# Patient Record
Sex: Female | Born: 1980 | Race: White | Hispanic: No | Marital: Married | Smoking: Former smoker
Health system: Southern US, Community
[De-identification: ages and names within clinical notes are randomized; demographics above are authoritative.]

## PROBLEM LIST (undated history)

## (undated) DIAGNOSIS — J302 Other seasonal allergic rhinitis: Secondary | ICD-10-CM

## (undated) DIAGNOSIS — I341 Nonrheumatic mitral (valve) prolapse: Secondary | ICD-10-CM

## (undated) DIAGNOSIS — M533 Sacrococcygeal disorders, not elsewhere classified: Secondary | ICD-10-CM

## (undated) DIAGNOSIS — G8929 Other chronic pain: Secondary | ICD-10-CM

## (undated) DIAGNOSIS — K824 Cholesterolosis of gallbladder: Secondary | ICD-10-CM

## (undated) DIAGNOSIS — Z98811 Dental restoration status: Secondary | ICD-10-CM

## (undated) DIAGNOSIS — K811 Chronic cholecystitis: Secondary | ICD-10-CM

## (undated) DIAGNOSIS — J45909 Unspecified asthma, uncomplicated: Secondary | ICD-10-CM

## (undated) DIAGNOSIS — L309 Dermatitis, unspecified: Secondary | ICD-10-CM

## (undated) DIAGNOSIS — A6000 Herpesviral infection of urogenital system, unspecified: Secondary | ICD-10-CM

## (undated) HISTORY — DX: Unspecified asthma, uncomplicated: J45.909

## (undated) HISTORY — PX: WISDOM TOOTH EXTRACTION: SHX21

## (undated) HISTORY — DX: Nonrheumatic mitral (valve) prolapse: I34.1

---

## 2009-10-25 ENCOUNTER — Other Ambulatory Visit: Admission: RE | Admit: 2009-10-25 | Discharge: 2009-10-25 | Payer: Self-pay | Admitting: Family Medicine

## 2010-11-29 ENCOUNTER — Other Ambulatory Visit (HOSPITAL_COMMUNITY)
Admission: RE | Admit: 2010-11-29 | Discharge: 2010-11-29 | Disposition: A | Discharge status: Home / Self Care | Payer: BLUE CROSS/BLUE SHIELD | Source: Ambulatory Visit | Attending: Family Medicine | Admitting: Family Medicine

## 2010-11-29 ENCOUNTER — Other Ambulatory Visit: Payer: Self-pay | Admitting: Family Medicine

## 2010-11-29 DIAGNOSIS — Z124 Encounter for screening for malignant neoplasm of cervix: Secondary | ICD-10-CM | POA: Insufficient documentation

## 2013-01-14 ENCOUNTER — Other Ambulatory Visit: Payer: Self-pay | Admitting: Family Medicine

## 2013-01-14 ENCOUNTER — Other Ambulatory Visit (HOSPITAL_COMMUNITY)
Admission: RE | Admit: 2013-01-14 | Discharge: 2013-01-14 | Disposition: A | Discharge status: Home / Self Care | Payer: BC Managed Care – PPO | Source: Ambulatory Visit | Attending: Family Medicine | Admitting: Family Medicine

## 2013-01-14 DIAGNOSIS — Z113 Encounter for screening for infections with a predominantly sexual mode of transmission: Secondary | ICD-10-CM | POA: Insufficient documentation

## 2013-01-14 DIAGNOSIS — Z124 Encounter for screening for malignant neoplasm of cervix: Secondary | ICD-10-CM | POA: Insufficient documentation

## 2013-01-15 ENCOUNTER — Other Ambulatory Visit: Payer: Self-pay | Admitting: Family Medicine

## 2013-01-15 DIAGNOSIS — Z0000 Encounter for general adult medical examination without abnormal findings: Secondary | ICD-10-CM

## 2013-01-15 DIAGNOSIS — R102 Pelvic and perineal pain: Secondary | ICD-10-CM

## 2013-01-29 ENCOUNTER — Ambulatory Visit
Admission: RE | Admit: 2013-01-29 | Discharge: 2013-01-29 | Disposition: A | Discharge status: Home / Self Care | Payer: BC Managed Care – PPO | Source: Ambulatory Visit | Attending: Family Medicine | Admitting: Family Medicine

## 2013-01-29 DIAGNOSIS — R102 Pelvic and perineal pain: Secondary | ICD-10-CM

## 2013-01-29 DIAGNOSIS — Z0000 Encounter for general adult medical examination without abnormal findings: Secondary | ICD-10-CM

## 2013-04-28 ENCOUNTER — Other Ambulatory Visit: Payer: Self-pay | Admitting: Gastroenterology

## 2013-04-28 DIAGNOSIS — R109 Unspecified abdominal pain: Secondary | ICD-10-CM

## 2013-05-02 ENCOUNTER — Ambulatory Visit
Admission: RE | Admit: 2013-05-02 | Discharge: 2013-05-02 | Disposition: A | Discharge status: Home / Self Care | Payer: BC Managed Care – PPO | Source: Ambulatory Visit | Attending: Gastroenterology | Admitting: Gastroenterology

## 2013-05-02 DIAGNOSIS — R109 Unspecified abdominal pain: Secondary | ICD-10-CM

## 2013-05-28 ENCOUNTER — Other Ambulatory Visit (HOSPITAL_COMMUNITY): Payer: Self-pay | Admitting: Gastroenterology

## 2013-05-28 DIAGNOSIS — R109 Unspecified abdominal pain: Secondary | ICD-10-CM

## 2013-06-03 ENCOUNTER — Encounter (HOSPITAL_COMMUNITY)
Admission: RE | Admit: 2013-06-03 | Discharge: 2013-06-03 | Disposition: A | Discharge status: Home / Self Care | Payer: BC Managed Care – PPO | Source: Ambulatory Visit | Attending: Gastroenterology | Admitting: Gastroenterology

## 2013-06-03 DIAGNOSIS — R109 Unspecified abdominal pain: Secondary | ICD-10-CM | POA: Insufficient documentation

## 2013-06-03 MED ORDER — TECHNETIUM TC 99M MEBROFENIN IV KIT
5.3000 | PACK | Freq: Once | INTRAVENOUS | Status: AC | PRN
  Administered 2013-06-03: 5.3 via INTRAVENOUS

## 2013-06-12 ENCOUNTER — Ambulatory Visit (INDEPENDENT_AMBULATORY_CARE_PROVIDER_SITE_OTHER): Payer: Self-pay | Admitting: Surgery

## 2013-06-16 DIAGNOSIS — K811 Chronic cholecystitis: Secondary | ICD-10-CM

## 2013-06-16 DIAGNOSIS — K824 Cholesterolosis of gallbladder: Secondary | ICD-10-CM

## 2013-06-16 HISTORY — DX: Cholesterolosis of gallbladder: K82.4

## 2013-06-16 HISTORY — DX: Chronic cholecystitis: K81.1

## 2013-07-02 ENCOUNTER — Encounter (INDEPENDENT_AMBULATORY_CARE_PROVIDER_SITE_OTHER): Payer: Self-pay | Admitting: General Surgery

## 2013-07-02 ENCOUNTER — Telehealth (INDEPENDENT_AMBULATORY_CARE_PROVIDER_SITE_OTHER): Payer: Self-pay

## 2013-07-02 ENCOUNTER — Ambulatory Visit (INDEPENDENT_AMBULATORY_CARE_PROVIDER_SITE_OTHER): Payer: BC Managed Care – PPO | Admitting: General Surgery

## 2013-07-02 VITALS — BP 122/70 | HR 64 | Resp 16 | Ht 71.0 in | Wt 190.0 lb

## 2013-07-02 DIAGNOSIS — K811 Chronic cholecystitis: Secondary | ICD-10-CM

## 2013-07-02 DIAGNOSIS — K824 Cholesterolosis of gallbladder: Secondary | ICD-10-CM | POA: Insufficient documentation

## 2013-07-02 NOTE — Progress Notes (Addendum)
Patient ID: Christina Griffin, female   DOB: [birth year], 32 y.o.   MRN: [id number]  Chief Complaint  Patient presents with  . New Evaluation    eval GB    HPI Christina Griffin is a 32 y.o. female.  She is referred by Dr. Dorena Cookey for consideration of cholecystectomy. Her primary care physician is Dr. Juluis Rainier.  This is a very pleasant and generally very healthy young woman. She has a 3 month history of intermittent attacks of epigastric pain and sometimes right upper quadrant pain. There is no specific time of day that cause of her associated with this. This is not necessarily related to meals. This been no diarrhea no fevers no nausea no vomiting.She does feel bloated, and that is a new symptom as well.  She saw Dr. Madilyn Fireman. She took Nexium for 10 days without any relief of her symptoms. She had ultrasound which shows 2 filling defects which are non-mobile and were said to be gallbladder polyps. The ultrasound was otherwise completely normal. She had a hepatobiliary scan which actually showed a high ejection fraction, but her pain was reproduced with CCK infusion. No labs done. The only family history is a paternal grandmother who had a cholecystectomy.  Her past history is significant for mild intermittent asthma and mild anxiety. 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 She is on OCPs currently. She wants to have children.   Her aunt is with her throughout the encounter.   She had numerous helpful questions and took lots of notes. HPI  Past Medical History  Diagnosis Date  . Asthma     Past Surgical History  Procedure Laterality Date  . Wisdom tooth extraction      Family History  Problem Relation Age of Onset  . Cancer Paternal Aunt     breast    Social History History  Substance Use Topics  . Smoking status: Former Games developer  . Smokeless tobacco: Never Used  . Alcohol Use: Yes     Comment: 1-2/week    No  Known Allergies  Current Outpatient Prescriptions  Medication Sig Dispense Refill  . Multiple Vitamin (MULTI-VITAMIN DAILY PO) Take by mouth.      Marland Kitchen NUVARING 0.12-0.015 MG/24HR vaginal ring        No current facility-administered medications for this visit.    Review of Systems Review of Systems  Constitutional: Negative for fever, chills and unexpected weight change.  HENT: Negative for hearing loss, congestion, sore throat, trouble swallowing and voice change.   Eyes: Negative for visual disturbance.  Respiratory: Negative for cough and wheezing.   Cardiovascular: Negative for chest pain, palpitations and leg swelling.  Gastrointestinal: Positive for abdominal pain. Negative for nausea, vomiting, diarrhea, constipation, blood in stool, abdominal distention and anal bleeding.  Genitourinary: Negative for hematuria, vaginal bleeding and difficulty urinating.  Musculoskeletal: Negative for arthralgias.  Skin: Negative for rash and wound.  Neurological: Negative for seizures, syncope and headaches.  Hematological: Negative for adenopathy. Does not bruise/bleed easily.  Psychiatric/Behavioral: Negative for confusion.    Blood pressure 122/70, pulse 64, resp. rate 16, height 5\' 11"  (1.803 m), weight 190 lb (86.183 kg), last menstrual period 06/02/2013.  Physical Exam Physical Exam  Constitutional: She is oriented to person, place, and time. She appears well-developed and well-nourished. No distress.  HENT:  Head: Normocephalic and atraumatic.  Nose: Nose normal.  Mouth/Throat: No oropharyngeal exudate.  Eyes: Conjunctivae and EOM are  normal. Pupils are equal, round, and reactive to light. Left eye exhibits no discharge. No scleral icterus.  Neck: Neck supple. No JVD present. No tracheal deviation present. No thyromegaly present.  Cardiovascular: Normal rate, regular rhythm, normal heart sounds and intact distal pulses.   No murmur heard. Pulmonary/Chest: Effort normal and breath  sounds normal. No respiratory distress. She has no wheezes. She has no rales. She exhibits no tenderness.  Abdominal: Soft. Bowel sounds are normal. She exhibits no distension and no mass. There is no tenderness. There is no rebound and no guarding.  Musculoskeletal: She exhibits no edema and no tenderness.  Lymphadenopathy:    She has no cervical adenopathy.  Neurological: She is alert and oriented to person, place, and time. She exhibits normal muscle tone. Coordination normal.  Skin: Skin is warm. No rash noted. She is not diaphoretic. No erythema. No pallor.  Psychiatric: She has a normal mood and affect. Her behavior is normal. Judgment and thought content normal.    Data Reviewed Office notes from Dr. Dorena Cookey. Ultrasound. Hepatobiliary scan.  Assessment    Chronic cholecystitis and gallbladder polyps. She may have gallstones which are nonmobile. I suspect she is having biliary colic, although I cannot be 100% certain. Location of pain, repetitive pattern of pain, duration of symptoms, failure to respond to Nexium, imaging studies all suggest that biliary colic is the most likely etiology. I offered elective cholecystectomy to her as an option, and she would prefer to have that surgery rather than pursue further GI workup at this time. She is aware that if her symptoms persist, that she will need extensive GI workup thereafter.  Mild asthma     Plan    Scheduled for a laparoscopic cholecystectomy with cholangiogram as outpatient.  I discussed the indications, details, techniques and numerous risks of the surgery with her. I  discussed the patient information booklet with her including diagrams of the anatomy. She is aware that there is a possibility of conversion to open laparotomy, bleeding, infection, injury to adjacent organs, bile leak, hernia, and other unforeseen problems. She understands all of these issues well and all of her questions are answered. She agrees with this plan.         Angelia Mould. Derrell Lolling, M.D., [facility] General and Minimally invasive Surgery Breast and Colorectal Surgery Office:   [phone] Pager:   [phone]  07/02/2013, 5:42 PM

## 2013-07-02 NOTE — Patient Instructions (Signed)
You have had intermittent episodes of upper abdominal and right upper quadrant pain for almost 3 months now. You have filling defects in your gallbladder on the ultrasound, and abdominal pain was reproduced by the CCK infusion at the time of the HIDA-scan. You had had no improvement in your symptoms with a 10 day course of Nexium.  All these  factors strongly suggest that the gallbladder is the source of your pain, although we cannot be 100% certain.  You'll be scheduled for laparoscopic cholecystectomy with cholangiogram, possible open cholecystectomy in the near future.      Laparoscopic Cholecystectomy Laparoscopic cholecystectomy is surgery to remove the gallbladder. The gallbladder is located slightly to the right of center in the abdomen, behind the liver. It is a concentrating and storage sac for the bile produced in the liver. Bile aids in the digestion and absorption of fats. Gallbladder disease (cholecystitis) is an inflammation of your gallbladder. This condition is usually caused by a buildup of gallstones (cholelithiasis) in your gallbladder. Gallstones can block the flow of bile, resulting in inflammation and pain. In severe cases, emergency surgery may be required. When emergency surgery is not required, you will have time to prepare for the procedure. Laparoscopic surgery is an alternative to open surgery. Laparoscopic surgery usually has a shorter recovery time. Your common bile duct may also need to be examined and explored. Your caregiver will discuss this with you if he or she feels this should be done. If stones are found in the common bile duct, they may be removed. LET YOUR CAREGIVER KNOW ABOUT:  Allergies to food or medicine.  Medicines taken, including vitamins, herbs, eyedrops, over-the-counter medicines, and creams.  Use of steroids (by mouth or creams).  Previous problems with anesthetics or numbing medicines.  History of bleeding problems or blood  clots.  Previous surgery.  Other health problems, including diabetes and kidney problems.  Possibility of pregnancy, if this applies. RISKS AND COMPLICATIONS All surgery is associated with risks. Some problems that may occur following this procedure include:  Infection.  Damage to the common bile duct, nerves, arteries, veins, or other internal organs such as the stomach or intestines.  Bleeding.  A stone may remain in the common bile duct. BEFORE THE PROCEDURE  Do not take aspirin for 3 days prior to surgery or blood thinners for 1 week prior to surgery.  Do not eat or drink anything after midnight the night before surgery.  Let your caregiver know if you develop a cold or other infectious problem prior to surgery.  You should be present 60 minutes before the procedure or as directed. PROCEDURE  You will be given medicine that makes you sleep (general anesthetic). When you are asleep, your surgeon will make several small cuts (incisions) in your abdomen. One of these incisions is used to insert a small, lighted scope (laparoscope) into the abdomen. The laparoscope helps the surgeon see into your abdomen. Carbon dioxide gas will be pumped into your abdomen. The gas allows more room for the surgeon to perform your surgery. Other operating instruments are inserted through the other incisions. Laparoscopic procedures may not be appropriate when:  There is major scarring from previous surgery.  The gallbladder is extremely inflamed.  There are bleeding disorders or unexpected cirrhosis of the liver.  A pregnancy is near term.  Other conditions make the laparoscopic procedure impossible. If your surgeon feels it is not safe to continue with a laparoscopic procedure, he or she will perform an open abdominal  procedure. In this case, the surgeon will make an incision to open the abdomen. This gives the surgeon a larger view and field to work within. This may allow the surgeon to  perform procedures that sometimes cannot be performed with a laparoscope alone. Open surgery has a longer recovery time. AFTER THE PROCEDURE  You will be taken to the recovery area where a nurse will watch and check your progress.  You may be allowed to go home the same day.  Do not resume physical activities until directed by your caregiver.  You may resume a normal diet and activities as directed. Document Released: 10/02/2005 Document Revised: 12/25/2011 Document Reviewed: 03/17/2011 [facility] Patient Information 2014 [place], [place].

## 2013-07-02 NOTE — Telephone Encounter (Signed)
I left a message for the pt to call me and see if she can come in at 3:30 for a 4:00 appointment.  We had someone cancel.

## 2013-07-15 ENCOUNTER — Encounter (HOSPITAL_BASED_OUTPATIENT_CLINIC_OR_DEPARTMENT_OTHER): Payer: Self-pay | Admitting: *Deleted

## 2013-07-15 NOTE — Pre-Procedure Instructions (Signed)
To come for CBC, diff, CMET 

## 2013-07-16 ENCOUNTER — Encounter (HOSPITAL_BASED_OUTPATIENT_CLINIC_OR_DEPARTMENT_OTHER)
Admission: RE | Admit: 2013-07-16 | Discharge: 2013-07-16 | Disposition: A | Discharge status: Home / Self Care | Payer: BC Managed Care – PPO | Source: Ambulatory Visit | Attending: General Surgery | Admitting: General Surgery

## 2013-07-16 LAB — CBC WITH DIFFERENTIAL/PLATELET
Basophils Absolute: 0 10*3/uL (ref 0.0–0.1)
Basophils Relative: 0 % (ref 0–1)
Eosinophils Absolute: 0.3 10*3/uL (ref 0.0–0.7)
Hemoglobin: 12.5 g/dL (ref 12.0–15.0)
MCH: 30.3 pg (ref 26.0–34.0)
MCHC: 33.9 g/dL (ref 30.0–36.0)
MCV: 89.6 fL (ref 78.0–100.0)
Monocytes Absolute: 0.6 10*3/uL (ref 0.1–1.0)
Monocytes Relative: 7 % (ref 3–12)
Neutro Abs: 4.7 10*3/uL (ref 1.7–7.7)
Neutrophils Relative %: 55 % (ref 43–77)
RDW: 13 % (ref 11.5–15.5)

## 2013-07-16 LAB — COMPREHENSIVE METABOLIC PANEL
AST: 14 U/L (ref 0–37)
Albumin: 3.8 g/dL (ref 3.5–5.2)
Chloride: 105 mEq/L (ref 96–112)
Creatinine, Ser: 0.61 mg/dL (ref 0.50–1.10)
Glucose, Bld: 82 mg/dL (ref 70–99)
Total Bilirubin: 0.4 mg/dL (ref 0.3–1.2)

## 2013-07-17 NOTE — H&P (Signed)
thatiana Griffin   MRN:  [id number]   Description: 32 year old female  Provider: [name], MD  Department: [facility]        Diagnoses    Chronic cholecystitis    -  Primary    575.11    Gallbladder polyp        575.6      Reason for Visit    New Evaluation    eval GB        Current Vitals - Last Recorded    BP Pulse Resp Ht Wt BMI    122/70 64 16 5\' 11"  (1.803 m) 190 lb (86.183 kg) 26.51 kg/m2           History and Physical   [name], MD    Status: Signed                       HPI Christina Griffin is a 32 y.o. female.  She is referred by Dr. Dorena Cookey for consideration of cholecystectomy. Her primary care physician is Dr. Juluis Rainier.   This is a very pleasant and generally very healthy young woman. She has a 3 month history of intermittent attacks of epigastric pain and sometimes right upper quadrant pain. There is no specific time of day that cause of her associated with this. This is not necessarily related to meals. This been no diarrhea no fevers no nausea no vomiting.She does feel bloated, and that is a new symptom as well.   She saw Dr. Madilyn Fireman. She took Nexium for 10 days without any relief of her symptoms. She had ultrasound which shows 2 filling defects which are non-mobile and were said to be gallbladder polyps. The ultrasound was otherwise completely normal. She had a hepatobiliary scan which actually showed a high ejection fraction, but her pain was reproduced with CCK infusion. No labs done. The only family history is a paternal grandmother who had a cholecystectomy.   Her past history is significant for mild intermittent asthma and mild anxiety. 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 She is on OCPs currently. She wants to have children.    Her aunt is with her throughout the encounter.   She had numerous helpful questions and took lots of notes.        Past Medical History   Diagnosis  Date   .  Asthma           Past Surgical History   Procedure  Laterality  Date   .  Wisdom tooth extraction             Family History   Problem  Relation  Age of Onset   .  Cancer  Paternal Aunt         breast        Social History History   Substance Use Topics   .  Smoking status:  Former Games developer   .  Smokeless tobacco:  Never Used   .  Alcohol Use:  Yes         Comment: 1-2/week        No Known Allergies    Current Outpatient Prescriptions   Medication  Sig  Dispense  Refill   .  Multiple Vitamin (MULTI-VITAMIN DAILY PO)  Take by mouth.         Marland Kitchen  NUVARING 0.12-0.015 MG/24HR vaginal ring  Review of Systems   Constitutional: Negative for fever, chills and unexpected weight change.  HENT: Negative for hearing loss, congestion, sore throat, trouble swallowing and voice change.   Eyes: Negative for visual disturbance.  Respiratory: Negative for cough and wheezing.   Cardiovascular: Negative for chest pain, palpitations and leg swelling.  Gastrointestinal: Positive for abdominal pain. Negative for nausea, vomiting, diarrhea, constipation, blood in stool, abdominal distention and anal bleeding.  Genitourinary: Negative for hematuria, vaginal bleeding and difficulty urinating.  Musculoskeletal: Negative for arthralgias.  Skin: Negative for rash and wound.  Neurological: Negative for seizures, syncope and headaches.  Hematological: Negative for adenopathy. Does not bruise/bleed easily.  Psychiatric/Behavioral: Negative for confusion.      Blood pressure 122/70, pulse 64, resp. rate 16, height 5\' 11"  (1.803 m), weight 190 lb (86.183 kg), last menstrual period 06/02/2013.   Physical Exam  Constitutional: She is oriented to person, place, and time. She appears well-developed and well-nourished. No distress.  HENT:   Head: Normocephalic and atraumatic.   Nose: Nose normal.   Mouth/Throat: No  oropharyngeal exudate.  Eyes: Conjunctivae and EOM are normal. Pupils are equal, round, and reactive to light. Left eye exhibits no discharge. No scleral icterus.  Neck: Neck supple. No JVD present. No tracheal deviation present. No thyromegaly present.  Cardiovascular: Normal rate, regular rhythm, normal heart sounds and intact distal pulses.    No murmur heard. Pulmonary/Chest: Effort normal and breath sounds normal. No respiratory distress. She has no wheezes. She has no rales. She exhibits no tenderness.  Abdominal: Soft. Bowel sounds are normal. She exhibits no distension and no mass. There is no tenderness. There is no rebound and no guarding.  Musculoskeletal: She exhibits no edema and no tenderness.  Lymphadenopathy:    She has no cervical adenopathy.  Neurological: She is alert and oriented to person, place, and time. She exhibits normal muscle tone. Coordination normal.  Skin: Skin is warm. No rash noted. She is not diaphoretic. No erythema. No pallor.  Psychiatric: She has a normal mood and affect. Her behavior is normal. Judgment and thought content normal.      Data Reviewed Office notes from Dr. Dorena Cookey. Ultrasound. Hepatobiliary scan.   Assessment    Chronic cholecystitis and gallbladder polyps. She may have gallstones which are nonmobile. I suspect she is having biliary colic, although I cannot be 100% certain. Location of pain, repetitive pattern of pain, duration of symptoms, failure to respond to Nexium, imaging studies all suggest that biliary colic is the most likely etiology. I offered elective cholecystectomy to her as an option, and she would prefer to have that surgery resident to pursue further GI workup at this time. She is aware that if her symptoms persist, that she will need extensive GI workup thereafter.   Mild asthma      Plan    Scheduled for a laparoscopic cholecystectomy with cholangiogram as outpatient.   I discussed the indications, details,  techniques and numerous risks of the surgery with her. Abdomen the patient information booklet with her including diagrams of the anatomy. She is that there is a possibility of conversion to open laparotomy, bleeding, infection, injury to adjacent organs, bile leak, hernia, and other unforeseen problems. She understands all of these issues well and all of her questions rancher. She agrees with this plan.           Angelia Mould. Derrell Lolling, M.D., [facility] General and Minimally invasive Surgery Breast and Colorectal Surgery Office:  [phone] Pager:   [phone]

## 2013-07-18 ENCOUNTER — Ambulatory Visit (HOSPITAL_COMMUNITY): Payer: BC Managed Care – PPO

## 2013-07-18 ENCOUNTER — Ambulatory Visit (HOSPITAL_BASED_OUTPATIENT_CLINIC_OR_DEPARTMENT_OTHER)
Admission: RE | Admit: 2013-07-18 | Discharge: 2013-07-18 | Disposition: A | Discharge status: Home / Self Care | Payer: BC Managed Care – PPO | Source: Ambulatory Visit | Attending: General Surgery | Admitting: General Surgery

## 2013-07-18 ENCOUNTER — Encounter (HOSPITAL_BASED_OUTPATIENT_CLINIC_OR_DEPARTMENT_OTHER): Payer: Self-pay | Admitting: Anesthesiology

## 2013-07-18 ENCOUNTER — Encounter (HOSPITAL_BASED_OUTPATIENT_CLINIC_OR_DEPARTMENT_OTHER)
Admission: RE | Disposition: A | Discharge status: Home / Self Care | Payer: Self-pay | Source: Ambulatory Visit | Attending: General Surgery

## 2013-07-18 ENCOUNTER — Ambulatory Visit (HOSPITAL_BASED_OUTPATIENT_CLINIC_OR_DEPARTMENT_OTHER): Payer: BC Managed Care – PPO | Admitting: Anesthesiology

## 2013-07-18 ENCOUNTER — Encounter (HOSPITAL_BASED_OUTPATIENT_CLINIC_OR_DEPARTMENT_OTHER): Payer: Self-pay

## 2013-07-18 DIAGNOSIS — J45909 Unspecified asthma, uncomplicated: Secondary | ICD-10-CM | POA: Insufficient documentation

## 2013-07-18 DIAGNOSIS — R110 Nausea: Secondary | ICD-10-CM | POA: Insufficient documentation

## 2013-07-18 DIAGNOSIS — K824 Cholesterolosis of gallbladder: Secondary | ICD-10-CM

## 2013-07-18 DIAGNOSIS — K811 Chronic cholecystitis: Secondary | ICD-10-CM

## 2013-07-18 DIAGNOSIS — Z01818 Encounter for other preprocedural examination: Secondary | ICD-10-CM | POA: Insufficient documentation

## 2013-07-18 HISTORY — DX: Other chronic pain: G89.29

## 2013-07-18 HISTORY — DX: Chronic cholecystitis: K81.1

## 2013-07-18 HISTORY — DX: Cholesterolosis of gallbladder: K82.4

## 2013-07-18 HISTORY — DX: Sacrococcygeal disorders, not elsewhere classified: M53.3

## 2013-07-18 HISTORY — PX: CHOLECYSTECTOMY: SHX55

## 2013-07-18 HISTORY — DX: Dermatitis, unspecified: L30.9

## 2013-07-18 HISTORY — DX: Other seasonal allergic rhinitis: J30.2

## 2013-07-18 HISTORY — DX: Dental restoration status: Z98.811

## 2013-07-18 SURGERY — LAPAROSCOPIC CHOLECYSTECTOMY WITH INTRAOPERATIVE CHOLANGIOGRAM
Anesthesia: General | Site: Abdomen | Wound class: Clean Contaminated

## 2013-07-18 MED ORDER — LIDOCAINE HCL (CARDIAC) 20 MG/ML IV SOLN
INTRAVENOUS | Status: DC | PRN
  Administered 2013-07-18: 80 mg via INTRAVENOUS

## 2013-07-18 MED ORDER — SODIUM CHLORIDE 0.9 % IR SOLN
Status: DC | PRN
  Administered 2013-07-18: 1

## 2013-07-18 MED ORDER — MIDAZOLAM HCL 2 MG/ML PO SYRP: 12.0000 mg | ORAL_SOLUTION | Freq: Once | ORAL | Status: DC | PRN

## 2013-07-18 MED ORDER — SODIUM CHLORIDE 0.9 % IV SOLN: 250.0000 mL | INTRAVENOUS | Status: DC | PRN

## 2013-07-18 MED ORDER — ACETAMINOPHEN 325 MG PO TABS: 650.0000 mg | ORAL_TABLET | ORAL | Status: DC | PRN

## 2013-07-18 MED ORDER — OXYCODONE HCL 5 MG/5ML PO SOLN: 5.0000 mg | Freq: Once | ORAL | Status: DC | PRN

## 2013-07-18 MED ORDER — ONDANSETRON HCL 4 MG/2ML IJ SOLN: 4.0000 mg | Freq: Four times a day (QID) | INTRAMUSCULAR | Status: DC | PRN

## 2013-07-18 MED ORDER — PROPOFOL 10 MG/ML IV BOLUS
INTRAVENOUS | Status: DC | PRN
  Administered 2013-07-18: 50 mg via INTRAVENOUS
  Administered 2013-07-18: 200 mg via INTRAVENOUS

## 2013-07-18 MED ORDER — OXYCODONE HCL 5 MG PO TABS
5.0000 mg | ORAL_TABLET | Freq: Once | ORAL | Status: DC | PRN
Start: 2013-07-18 — End: 2013-07-18

## 2013-07-18 MED ORDER — MIDAZOLAM HCL 5 MG/5ML IJ SOLN
INTRAMUSCULAR | Status: DC | PRN
  Administered 2013-07-18: 2 mg via INTRAVENOUS

## 2013-07-18 MED ORDER — HYDROMORPHONE HCL PF 1 MG/ML IJ SOLN
0.2500 mg | INTRAMUSCULAR | Status: DC | PRN
  Administered 2013-07-18 (×3): 0.5 mg via INTRAVENOUS

## 2013-07-18 MED ORDER — ROCURONIUM BROMIDE 100 MG/10ML IV SOLN
INTRAVENOUS | Status: DC | PRN
  Administered 2013-07-18: 50 mg via INTRAVENOUS

## 2013-07-18 MED ORDER — BUPIVACAINE-EPINEPHRINE 0.5% -1:200000 IJ SOLN
INTRAMUSCULAR | Status: DC | PRN
  Administered 2013-07-18: 14 mL

## 2013-07-18 MED ORDER — HYDROCODONE-ACETAMINOPHEN 5-325 MG PO TABS: 1.0000 | ORAL_TABLET | ORAL | Status: DC | PRN

## 2013-07-18 MED ORDER — HEMOSTATIC AGENTS (NO CHARGE) OPTIME
TOPICAL | Status: DC | PRN
  Administered 2013-07-18: 1 via TOPICAL

## 2013-07-18 MED ORDER — SODIUM CHLORIDE 0.9 % IV SOLN
INTRAVENOUS | Status: DC | PRN
  Administered 2013-07-18: 14:00:00

## 2013-07-18 MED ORDER — ACETAMINOPHEN 650 MG RE SUPP: 650.0000 mg | RECTAL | Status: DC | PRN

## 2013-07-18 MED ORDER — FENTANYL CITRATE 0.05 MG/ML IJ SOLN
INTRAMUSCULAR | Status: DC | PRN
  Administered 2013-07-18 (×5): 50 ug via INTRAVENOUS

## 2013-07-18 MED ORDER — SODIUM CHLORIDE 0.9 % IV SOLN: INTRAVENOUS | Status: DC

## 2013-07-18 MED ORDER — FENTANYL CITRATE 0.05 MG/ML IJ SOLN: 50.0000 ug | INTRAMUSCULAR | Status: DC | PRN

## 2013-07-18 MED ORDER — CEFAZOLIN SODIUM-DEXTROSE 2-3 GM-% IV SOLR
2.0000 g | INTRAVENOUS | Status: AC
  Administered 2013-07-18: 2 g via INTRAVENOUS

## 2013-07-18 MED ORDER — LACTATED RINGERS IV SOLN
INTRAVENOUS | Status: DC
  Administered 2013-07-18 (×2): via INTRAVENOUS

## 2013-07-18 MED ORDER — PROMETHAZINE HCL 25 MG/ML IJ SOLN
6.2500 mg | INTRAMUSCULAR | Status: DC | PRN
  Administered 2013-07-18: 12.5 mg via INTRAVENOUS

## 2013-07-18 MED ORDER — MORPHINE SULFATE 2 MG/ML IJ SOLN: 2.0000 mg | INTRAMUSCULAR | Status: DC | PRN

## 2013-07-18 MED ORDER — SODIUM CHLORIDE 0.9 % IJ SOLN: 3.0000 mL | Freq: Two times a day (BID) | INTRAMUSCULAR | Status: DC

## 2013-07-18 MED ORDER — DEXAMETHASONE SODIUM PHOSPHATE 4 MG/ML IJ SOLN
INTRAMUSCULAR | Status: DC | PRN
  Administered 2013-07-18: 10 mg via INTRAVENOUS

## 2013-07-18 MED ORDER — NEOSTIGMINE METHYLSULFATE 1 MG/ML IJ SOLN
INTRAMUSCULAR | Status: DC | PRN
  Administered 2013-07-18: 3 mg via INTRAVENOUS

## 2013-07-18 MED ORDER — ONDANSETRON HCL 4 MG/2ML IJ SOLN
INTRAMUSCULAR | Status: DC | PRN
  Administered 2013-07-18: 4 mg via INTRAVENOUS

## 2013-07-18 MED ORDER — OXYCODONE HCL 5 MG PO TABS: 5.0000 mg | ORAL_TABLET | ORAL | Status: DC | PRN

## 2013-07-18 MED ORDER — EPHEDRINE SULFATE 50 MG/ML IJ SOLN
INTRAMUSCULAR | Status: DC | PRN
  Administered 2013-07-18: 10 mg via INTRAVENOUS

## 2013-07-18 MED ORDER — SODIUM CHLORIDE 0.9 % IJ SOLN: 3.0000 mL | INTRAMUSCULAR | Status: DC | PRN

## 2013-07-18 MED ORDER — GLYCOPYRROLATE 0.2 MG/ML IJ SOLN
INTRAMUSCULAR | Status: DC | PRN
  Administered 2013-07-18: 0.6 mg via INTRAVENOUS

## 2013-07-18 MED ORDER — MIDAZOLAM HCL 2 MG/2ML IJ SOLN: 1.0000 mg | INTRAMUSCULAR | Status: DC | PRN

## 2013-07-18 MED ORDER — KETOROLAC TROMETHAMINE 30 MG/ML IJ SOLN
INTRAMUSCULAR | Status: DC | PRN
  Administered 2013-07-18: 30 mg via INTRAVENOUS

## 2013-07-18 MED ORDER — CHLORHEXIDINE GLUCONATE 4 % EX LIQD: 1.0000 "application " | Freq: Once | CUTANEOUS | Status: DC

## 2013-07-18 SURGICAL SUPPLY — 45 items
ADH SKN CLS APL DERMABOND .7 (GAUZE/BANDAGES/DRESSINGS) ×2
APPLIER CLIP ROT 10 11.4 M/L (STAPLE) ×2
APR CLP MED LRG 11.4X10 (STAPLE) ×1
BAG SPEC RTRVL LRG 6X4 10 (ENDOMECHANICALS) ×1
BLADE SURG ROTATE 9660 (MISCELLANEOUS) IMPLANT
CANISTER SUCTION 1200CC (MISCELLANEOUS) ×1 IMPLANT
CANISTER SUCTION 2500CC (MISCELLANEOUS) ×1 IMPLANT
CHLORAPREP W/TINT 26ML (MISCELLANEOUS) ×2 IMPLANT
CLIP APPLIE ROT 10 11.4 M/L (STAPLE) ×1 IMPLANT
CLOTH BEACON ORANGE TIMEOUT ST (SAFETY) ×2 IMPLANT
COVER MAYO STAND STRL (DRAPES) ×1 IMPLANT
DECANTER SPIKE VIAL GLASS SM (MISCELLANEOUS) ×1 IMPLANT
DERMABOND ADVANCED (GAUZE/BANDAGES/DRESSINGS) ×2
DERMABOND ADVANCED .7 DNX12 (GAUZE/BANDAGES/DRESSINGS) ×1 IMPLANT
DRAPE C-ARM 42X72 X-RAY (DRAPES) ×1 IMPLANT
DRAPE UTILITY XL STRL (DRAPES) ×2 IMPLANT
ELECT REM PT RETURN 9FT ADLT (ELECTROSURGICAL) ×2
ELECTRODE REM PT RTRN 9FT ADLT (ELECTROSURGICAL) ×1 IMPLANT
FILTER SMOKE EVAC LAPAROSHD (FILTER) IMPLANT
GLOVE BIO SURGEON STRL SZ8 (GLOVE) ×2 IMPLANT
GLOVE BIOGEL PI IND STRL 7.0 (GLOVE) IMPLANT
GLOVE BIOGEL PI IND STRL 8 (GLOVE) IMPLANT
GLOVE BIOGEL PI INDICATOR 7.0 (GLOVE) ×1
GLOVE BIOGEL PI INDICATOR 8 (GLOVE) ×3
GLOVE ECLIPSE 6.5 STRL STRAW (GLOVE) ×1 IMPLANT
GLOVE EUDERMIC 7 POWDERFREE (GLOVE) ×2 IMPLANT
GLOVE EXAM NITRILE MD LF STRL (GLOVE) ×1 IMPLANT
GOWN PREVENTION PLUS XLARGE (GOWN DISPOSABLE) ×5 IMPLANT
GOWN PREVENTION PLUS XXLARGE (GOWN DISPOSABLE) ×1 IMPLANT
HEMOSTAT SNOW SURGICEL 2X4 (HEMOSTASIS) ×1 IMPLANT
NS IRRIG 1000ML POUR BTL (IV SOLUTION) IMPLANT
PACK BASIN DAY SURGERY FS (CUSTOM PROCEDURE TRAY) ×2 IMPLANT
POUCH SPECIMEN RETRIEVAL 10MM (ENDOMECHANICALS) ×2 IMPLANT
SCISSORS LAP 5X35 DISP (ENDOMECHANICALS) IMPLANT
SET CHOLANGIOGRAPH 5 50 .035 (SET/KITS/TRAYS/PACK) ×1 IMPLANT
SET IRRIG TUBING LAPAROSCOPIC (IRRIGATION / IRRIGATOR) ×2 IMPLANT
SLEEVE ENDOPATH XCEL 5M (ENDOMECHANICALS) ×2 IMPLANT
SLEEVE SCD COMPRESS KNEE MED (MISCELLANEOUS) ×2 IMPLANT
SUT MNCRL AB 4-0 PS2 18 (SUTURE) ×2 IMPLANT
SUT VICRYL 0 UR6 27IN ABS (SUTURE) IMPLANT
TOWEL OR 17X24 6PK STRL BLUE (TOWEL DISPOSABLE) ×2 IMPLANT
TRAY LAPAROSCOPIC (CUSTOM PROCEDURE TRAY) ×2 IMPLANT
TROCAR XCEL BLUNT TIP 100MML (ENDOMECHANICALS) ×2 IMPLANT
TROCAR XCEL NON-BLD 11X100MML (ENDOMECHANICALS) ×2 IMPLANT
TROCAR XCEL NON-BLD 5MMX100MML (ENDOMECHANICALS) ×2 IMPLANT

## 2013-07-18 NOTE — Transfer of Care (Signed)
Immediate Anesthesia Transfer of Care Note  Patient: Christina Griffin  Procedure(s) Performed: Procedure(s): LAPAROSCOPIC CHOLECYSTECTOMY WITH INTRAOPERATIVE CHOLANGIOGRAM (N/A)  Patient Location: PACU  Anesthesia Type:General  Level of Consciousness: awake, alert  and oriented  Airway & Oxygen Therapy: Patient Spontanous Breathing  Post-op Assessment: Report given to PACU RN  Post vital signs: Reviewed and stable  Complications: No apparent anesthesia complications

## 2013-07-18 NOTE — Progress Notes (Signed)
Patient c/o nausea, medicated as per order.

## 2013-07-18 NOTE — Anesthesia Procedure Notes (Signed)
Procedure Name: Intubation Date/Time: 07/18/2013 12:51 PM Performed by: Maris Berger T Pre-anesthesia Checklist: Patient identified, Emergency Drugs available, Suction available and Patient being monitored Patient Re-evaluated:Patient Re-evaluated prior to inductionOxygen Delivery Method: Circle System Utilized Preoxygenation: Pre-oxygenation with 100% oxygen Intubation Type: IV induction Ventilation: Mask ventilation without difficulty Laryngoscope Size: Mac and 3 Grade View: Grade I Tube type: Oral Tube size: 7.0 mm Number of attempts: 1 Airway Equipment and Method: stylet and oral airway Placement Confirmation: ETT inserted through vocal cords under direct vision,  positive ETCO2 and breath sounds checked- equal and bilateral Secured at: 22 cm Tube secured with: Tape Dental Injury: Teeth and Oropharynx as per pre-operative assessment

## 2013-07-18 NOTE — Interval H&P Note (Signed)
History and Physical Interval Note:  07/18/2013 12:16 PM  Christina Griffin  has presented today for surgery, with the diagnosis of Chronic cholecystitis and gallbladder polyps  The goals and the various methods of treatment have been discussed with the patient and family. After consideration of risks, benefits and other options for treatment, the patient has consented to  Procedure(s): LAPAROSCOPIC CHOLECYSTECTOMY WITH INTRAOPERATIVE CHOLANGIOGRAM(possible open) (N/A) as a surgical intervention .  The patient's history has been reviewed, patient examined, no change in status, stable for surgery.  I have reviewed the patient's chart and labs.  Questions were answered to the patient's satisfaction.     [name]

## 2013-07-18 NOTE — Anesthesia Postprocedure Evaluation (Signed)
  Anesthesia Post-op Note  Patient: Christina Griffin  Procedure(s) Performed: Procedure(s): LAPAROSCOPIC CHOLECYSTECTOMY WITH INTRAOPERATIVE CHOLANGIOGRAM (N/A)  Patient Location: PACU  Anesthesia Type:General  Level of Consciousness: awake and alert   Airway and Oxygen Therapy: Patient Spontanous Breathing  Post-op Pain: mild  Post-op Assessment: Post-op Vital signs reviewed, Patient's Cardiovascular Status Stable and Respiratory Function Stable  Post-op Vital Signs: stable  Complications: No apparent anesthesia complications

## 2013-07-18 NOTE — Anesthesia Preprocedure Evaluation (Signed)
Anesthesia Evaluation  Patient identified by MRN, date of birth, ID band Patient awake    History of Anesthesia Complications Negative for: history of anesthetic complications  Airway Mallampati: I  Neck ROM: Full    Dental  (+) Teeth Intact   Pulmonary asthma ,  breath sounds clear to auscultation        Cardiovascular Rhythm:Regular Rate:Normal     Neuro/Psych    GI/Hepatic negative GI ROS, Neg liver ROS,   Endo/Other  negative endocrine ROS  Renal/GU negative Renal ROS     Musculoskeletal negative musculoskeletal ROS (+)   Abdominal   Peds  Hematology negative hematology ROS (+)   Anesthesia Other Findings   Reproductive/Obstetrics negative OB ROS                           Anesthesia Physical Anesthesia Plan  ASA: I  Anesthesia Plan: General   Post-op Pain Management:    Induction: Intravenous  Airway Management Planned: Oral ETT  Additional Equipment:   Intra-op Plan:   Post-operative Plan: Extubation in OR  Informed Consent: I have reviewed the patients History and Physical, chart, labs and discussed the procedure including the risks, benefits and alternatives for the proposed anesthesia with the patient or authorized representative who has indicated his/her understanding and acceptance.   Dental advisory given  Plan Discussed with: CRNA and Surgeon  Anesthesia Plan Comments:         Anesthesia Quick Evaluation

## 2013-07-18 NOTE — Op Note (Signed)
Patient Name:           Christina Griffin   Date of Surgery:        07/18/2013  Pre op Diagnosis:      Chronic cholecystitis and gallbladder polyps  Post op Diagnosis:    same  Procedure:                 Laparoscopic cholecystectomy with cholangiogram  Surgeon:                     Angelia Mould. Derrell Lolling, M.D., FACS  Assistant:                      Karie Soda, M.D., FACS  Operative Indications:   Christina Griffin is a 32 y.o. female. She is referred by Dr. Dorena Cookey for consideration of cholecystectomy. Her primary care physician is Dr. Juluis Rainier.  This is a very pleasant and generally very healthy young woman. She has a 3 month history of intermittent attacks of epigastric pain and sometimes right upper quadrant pain. There is no specific time of day that cause of her associated with this. This is not necessarily related to meals. This been no diarrhea no fevers no nausea no vomiting.She does feel bloated, and that is a new symptom as well.  She saw Dr. Madilyn Fireman. She took Nexium for 10 days without any relief of her symptoms. She had ultrasound which shows 2 filling defects which are non-mobile and were said to be gallbladder polyps. The ultrasound was otherwise completely normal. She had a hepatobiliary scan which actually showed a high ejection fraction, but her pain was reproduced with CCK infusion. The only family history is a paternal grandmother who had a cholecystectomy.  Her past history is significant for mild intermittent asthma and mild anxiety. . She is brought to the operating room electively    Operative Findings:       The gallbladder was thin-walled but was discolored. Dissection of the gallbladder from its bed was somewhat difficult suggesting chronic inflammation. The anatomy of the cystic duct, cystic artery, and common bile duct were conventional. The cholangiogram was essentially normal showing normal intrahepatic and extrahepatic biliary anatomy, no filling  defects, and good flow of contrast into the duodenum. The diameter of the common hepatic duct and common bile duct were very narrow, no more than 2-3 mm. There were no strictures. The liver, stomach, duodenum, small intestine, and large intestine were grossly normal to inspection.  Procedure in Detail:          Following the induction of general endotracheal anesthesia the patient's abdomen was prepped and draped in a sterile fashion, intravenous antibiotics were given, and a surgical time out performed. 0.5% Marcaine with epinephrine was used as local infiltration anesthetic. A vertical incision was made at the lower rim of the umbilicus. The fascia was incised in the midline and the abdominal cavity entered under direct vision. An 11 mm Hassan trocar was inserted and secured the purse string suture of 0 Vicryl. Pneumoperitoneum was created and a videocamera was inserted. An 11 mm trocar was placed in the subxiphoid region and two 5 mm trocars in the right upper quadrant. The gallbladder fundus was elevated. The infundibulum was retracted. I dissected the peritoneum off of the right and left sides of the neck of the gallbladder. I carefully dissected out the cystic artery and cystic duct and created a large window behind these structures  giving a good critical view. The cystic artery was secured with multiple metal clips and divided. The cholangiogram catheter was inserted into the cystic duct and a cholangiogram was obtained using the C-arm. The findings were normal as described above. The cholangiogram catheter was removed, the cystic duct was secured with multiple metal clips and divided. The gallbladder was dissected from its bed with electrocautery, placed in a specimen bag and removed. The dissection of the gallbladder from its bed was somewhat tedious. We had to cauterize the liver in several places. We irrigated out the subhepatic and subphrenic spaces and they were completely clear.  I placed a piece of  Snow hemostatic sponge in the gallbladder bed. Everything looked fine. After removal of the gallbladder we released the pneumoperitoneum and removed the trocars. The fascia at the umbilicus was closed with 0 Vicryl sutures and the skin incisions closed with subcuticular sutures of 4-0 Monocryl and Dermabond. The patient was taken to recovery in stable condition. EBL 20 cc. Counts correct. Complications none.     Angelia Mould. Derrell Lolling, M.D., FACS General and Minimally Invasive Surgery Breast and Colorectal Surgery  07/18/2013 2:03 PM

## 2013-07-21 ENCOUNTER — Encounter (HOSPITAL_BASED_OUTPATIENT_CLINIC_OR_DEPARTMENT_OTHER): Payer: Self-pay | Admitting: General Surgery

## 2013-08-19 ENCOUNTER — Ambulatory Visit (INDEPENDENT_AMBULATORY_CARE_PROVIDER_SITE_OTHER): Payer: BC Managed Care – PPO | Admitting: General Surgery

## 2013-08-19 ENCOUNTER — Encounter (INDEPENDENT_AMBULATORY_CARE_PROVIDER_SITE_OTHER): Payer: Self-pay | Admitting: General Surgery

## 2013-08-19 VITALS — BP 118/66 | HR 80 | Temp 97.4°F | Resp 14 | Ht 71.0 in | Wt 183.4 lb

## 2013-08-19 DIAGNOSIS — K824 Cholesterolosis of gallbladder: Secondary | ICD-10-CM

## 2013-08-19 DIAGNOSIS — K811 Chronic cholecystitis: Secondary | ICD-10-CM

## 2013-08-19 NOTE — Patient Instructions (Signed)
You are recovering from your gallbladder surgery without any obvious surgical complications.  You have been given a copy of the pathology report which confirmed chronic cholecystitis and polyps.  You may resume normal physical activities without restriction  Low-fat diet is recommended  Return to see Dr. Derrell Lolling as needed.

## 2013-08-19 NOTE — Progress Notes (Signed)
Patient ID: Christina Griffin, female   DOB: 1981-04-13, 32 y.o.   MRN: [id number]  History: This patient underwent laparoscopic cholecystectomy with cholangiogram on 07/18/2013 for histologically confirmed chronic cholecystitis with benign adenomatous polyps. She is feeling much better. Appetite normal. Bowel movements basically normal. Still a little incisional soreness. She is pleased with her progress  Exam: Patient looks good. No distress. Abdomen soft. Incision is healing normally. No hernias  Assessment: Chronic cholecystitis with polyps. Recovering uneventfully following laparoscopic cholecystectomy with cholangiogram  Plan: Low fat diet Resume normal activities without restrictions Return to see me as needed.   Angelia Mould. Derrell Lolling, M.D., [facility] General and Minimally invasive Surgery Breast and Colorectal Surgery Office:   [phone] Pager:   [phone]

## 2013-10-14 ENCOUNTER — Encounter (INDEPENDENT_AMBULATORY_CARE_PROVIDER_SITE_OTHER): Payer: Self-pay | Admitting: General Surgery

## 2013-10-14 ENCOUNTER — Ambulatory Visit (INDEPENDENT_AMBULATORY_CARE_PROVIDER_SITE_OTHER): Payer: BC Managed Care – PPO | Admitting: General Surgery

## 2013-10-14 VITALS — BP 108/78 | HR 74 | Temp 98.4°F | Resp 16 | Ht 71.0 in | Wt 193.4 lb

## 2013-10-14 DIAGNOSIS — K811 Chronic cholecystitis: Secondary | ICD-10-CM

## 2013-10-14 DIAGNOSIS — K824 Cholesterolosis of gallbladder: Secondary | ICD-10-CM

## 2013-10-14 NOTE — Progress Notes (Signed)
Patient ID: azzie thiem, female   DOB: [birth year], 32 y.o.   MRN: [id number] History: This patient underwent laparoscopic cholecystectomy with cholangiogram on 07/18/2013. She has done very well and was discharged from our care. She returns because of a small eschar on the lower wound that has not completely healed. She is otherwise doing well.   exam: Patient looks good. No distress Abdomen soft. Nontender. Infraumbilical incision reveals a tiny, 2 mm eschar on the lower end. No cellulitis or abscess. No odor or purulence. After alcohol prep I debrided the eschar and I think I removed a few fragments of Monocryl suture. Redressed.  Assessment: Suture granuloma, anticipate this will heal spontaneously with just good wound care and hygiene. No antibiotics indicated  Plan: I explained my impression and discussed the pathophysiology with her. Wash wound gently with dial soap twice a day. Return if this has not healed significantly in 2 weeks. Otherwise see me when necessary.   Angelia Mould. Derrell Lolling, M.D., [facility] General and Minimally invasive Surgery Breast and Colorectal Surgery Office:   [phone] Pager:   [phone]

## 2013-10-14 NOTE — Patient Instructions (Signed)
The small scab, or eschar on your infraumbilical incision probably represents a small area that has not healed yet. Most likely this is due to retained suture material. I think that I remove the suture material today.  Wash this area once or twice a day with Dial soap..  Do not use any creams or ointments. Bactine is okay.  Return to see Dr. Derrell Lolling if this does not look significantly better in 2 weeks. We anticipate that this will heal completely.

## 2013-10-16 NOTE — L&D Delivery Note (Signed)
Delivery Note At 5:02 PM a viable female was delivered via Vaginal, Spontaneous Delivery (Presentation:occiput anterior  ;  ).  APGAR: 8, 9; weight  .   Placenta status: Intact, Spontaneous.  Cord: 3 vessels with the following complications: Knot.  Cord pH: NA  Anesthesia: Epidural  Episiotomy: None Lacerations: 2nd degree;Perineal Suture Repair: 3.0 vicryl Est. Blood Loss (mL): 350 Mom to postpartum.  Baby to Couplet care / Skin to Skin.  Caroleen Stoermer J. 08/17/2014, 5:39 PM

## 2013-11-21 IMAGING — US US PELVIS COMPLETE
1 series · 14 of 25 positions shown · non-contrast
Comparison: None

CLINICAL DATA: Right pelvic pain

TRANSABDOMINAL AND TRANSVAGINAL ULTRASOUND OF PELVIS
TECHNIQUE: Both transabdominal and transvaginal ultrasound
examinations of the pelvis were performed including evaluation of
the uterus, ovaries, adnexal regions, and pelvic cul-de-sac.

[Series 1: us pelvis complete · 0.13mm/px · 14 of 40 slices shown]
[im 1/40]
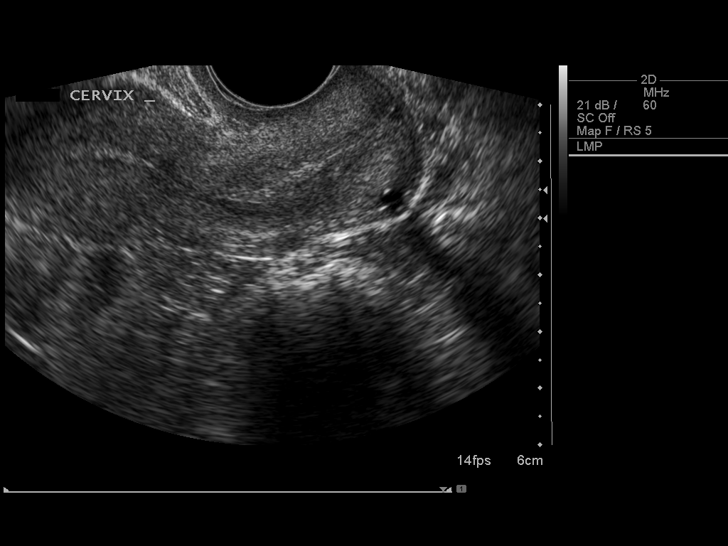
[im 4/40]
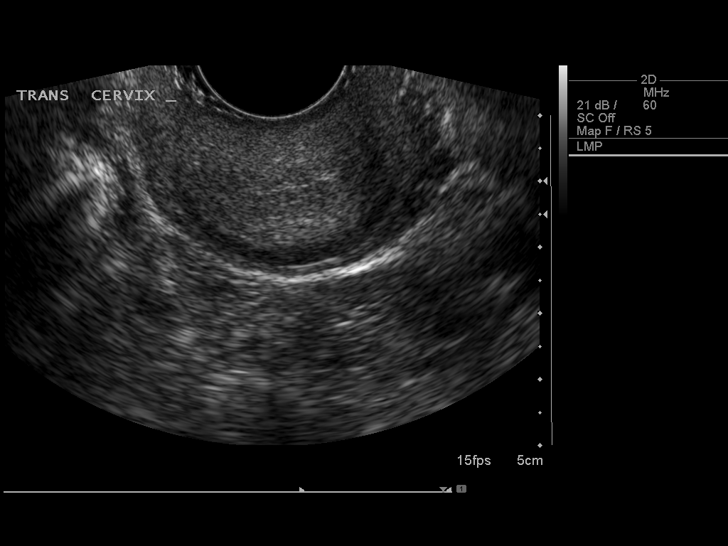
[im 7/40]
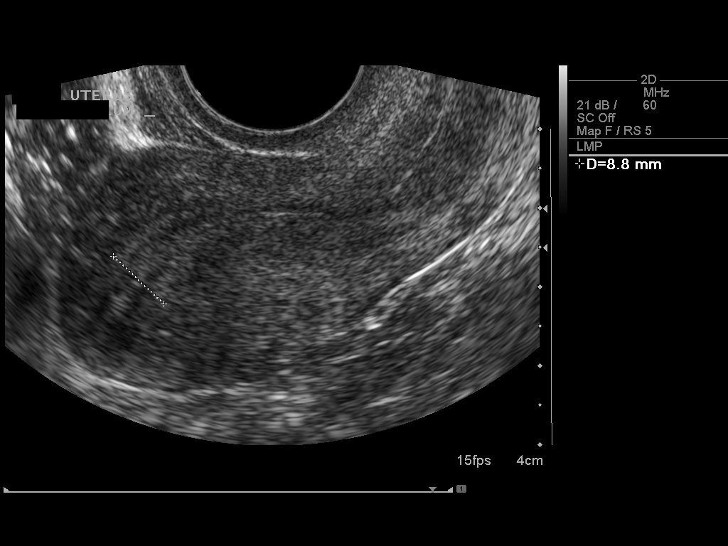
[im 10/40]
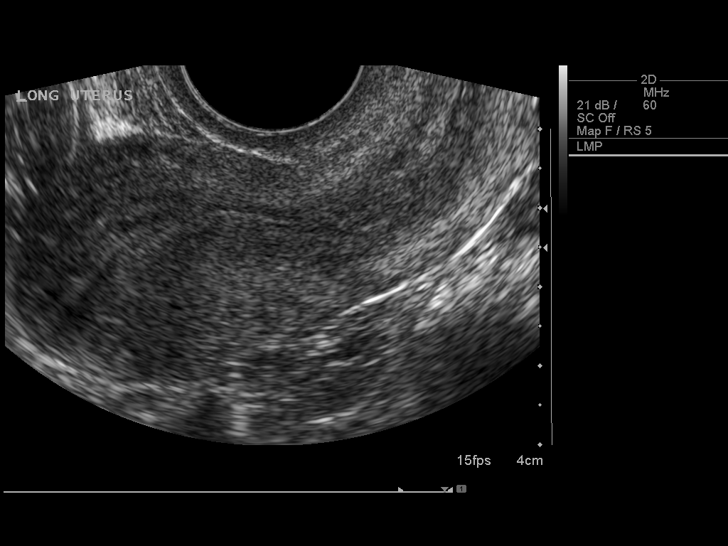
[im 14/40]
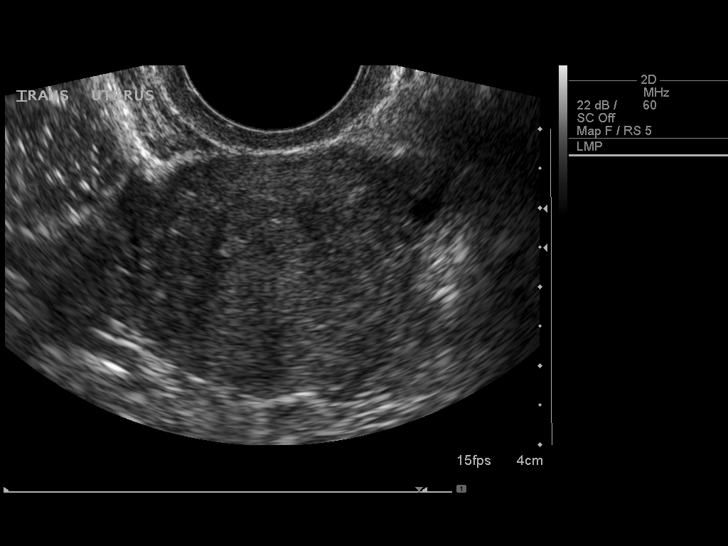
[im 15/40]
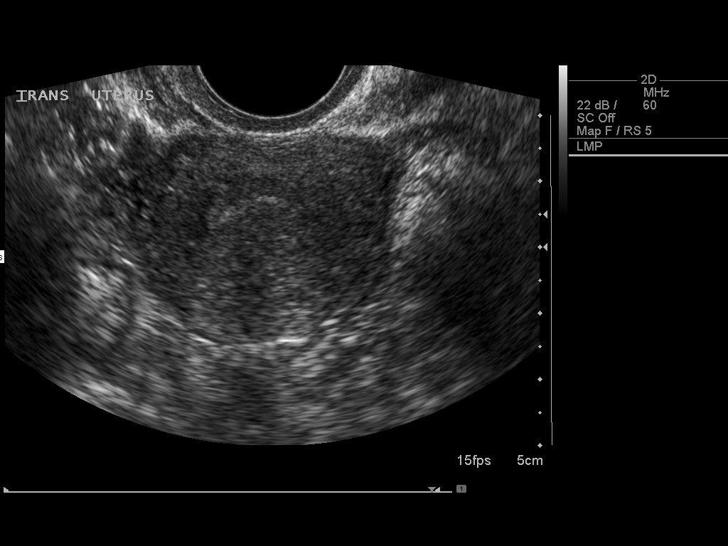
[im 18/40]
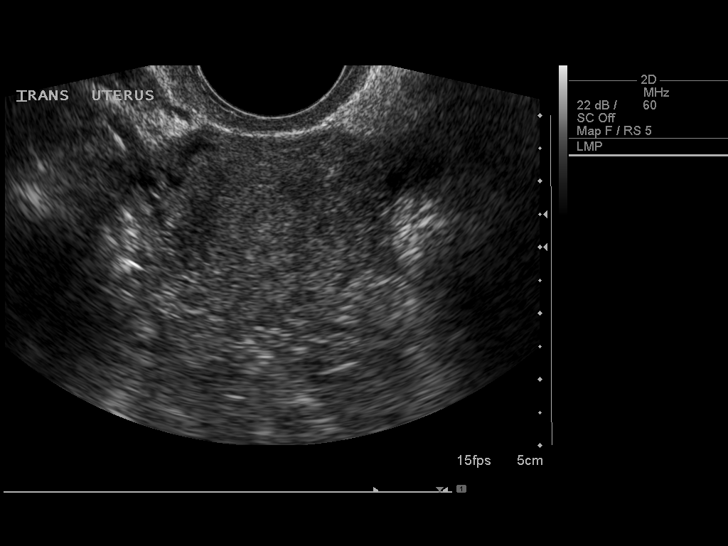
[im 22/40]
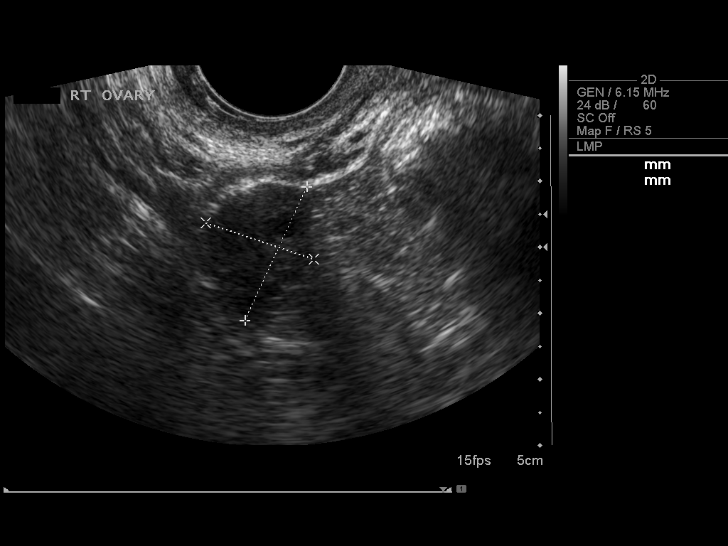
[im 25/40]
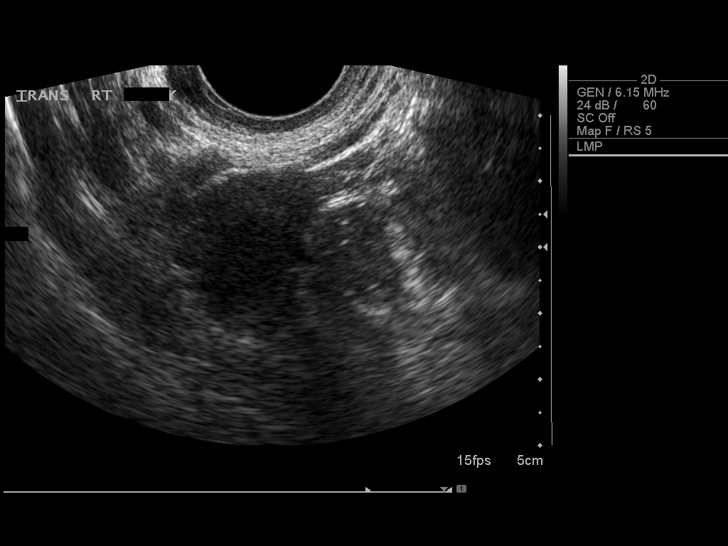
[im 27/40]
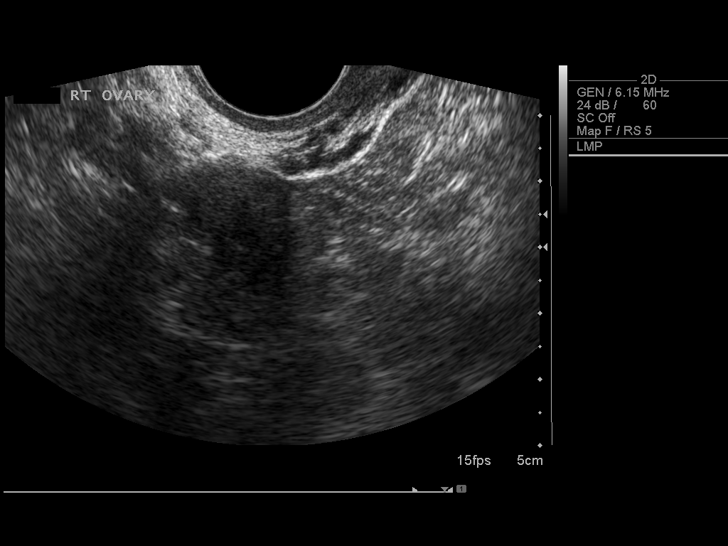
[im 30/40]
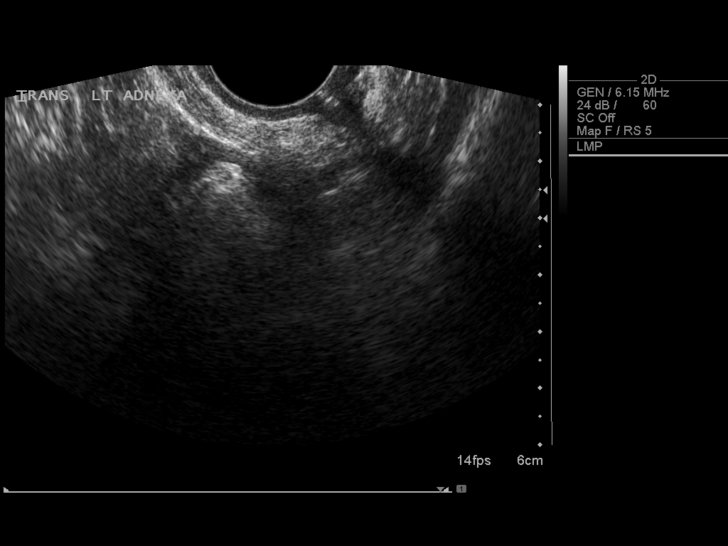
[im 33/40]
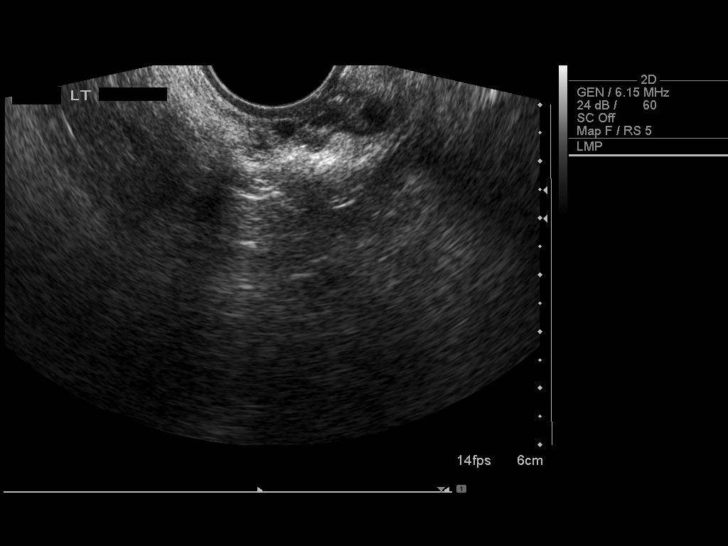
[im 36/40]
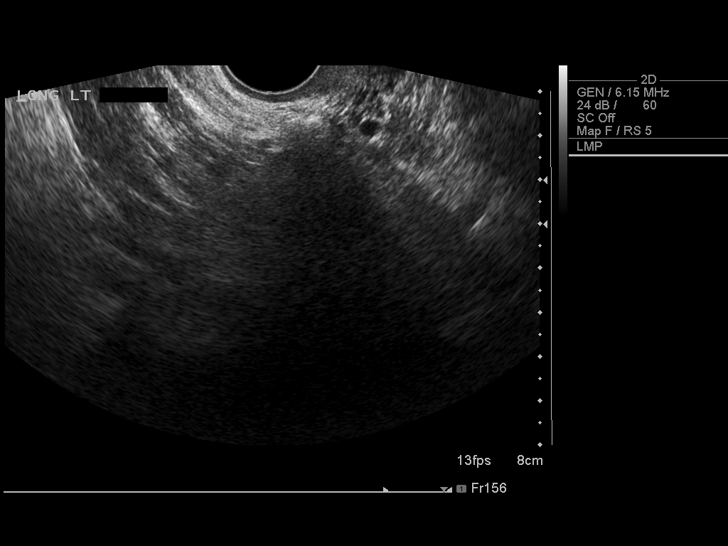
[im 40/40]
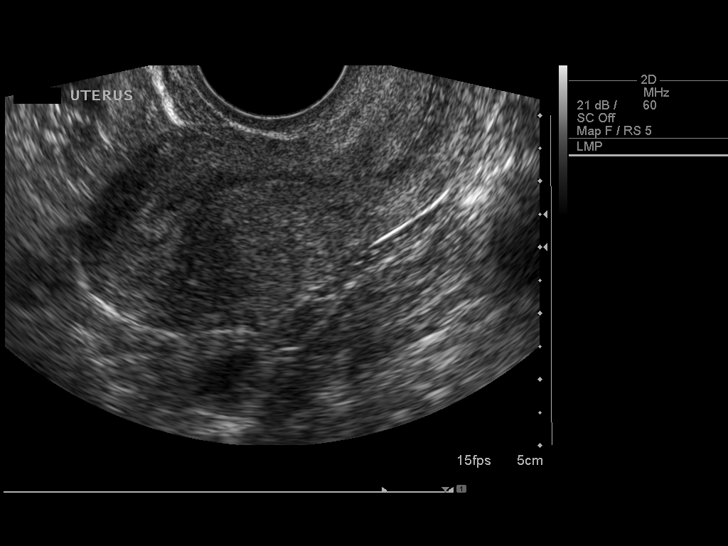

[14 of 25 positions shown; findings below may reference images not displayed]

FINDINGS: Uterus: The uterus measures 8.2 cm sagittally with a depth of
cm and width of 4.0 cm.  No myometrial abnormality is seen.

Endometrium:The endometrium is normal measuring 8.8 mm in
thickness.

Right Ovary :The right ovary is normal in size measuring 2.2 x
x 1.6 cm, with small follicles noted.

Left Ovary :The left ovary is obscured by bowel gas.

Other Findings:  No free fluid is seen.
IMPRESSION: 1.  The uterus is normal in size.  No endometrial abnormality is
seen.
2.  The right ovary is normal with small follicles.
3.  The left ovary is obscured by bowel gas.

## 2014-01-06 LAB — OB RESULTS CONSOLE RUBELLA ANTIBODY, IGM: Rubella: IMMUNE

## 2014-01-06 LAB — OB RESULTS CONSOLE HEPATITIS B SURFACE ANTIGEN: HEP B S AG: NEGATIVE

## 2014-02-05 LAB — OB RESULTS CONSOLE GC/CHLAMYDIA
CHLAMYDIA, DNA PROBE: NEGATIVE
GC PROBE AMP, GENITAL: NEGATIVE

## 2014-04-14 ENCOUNTER — Encounter: Payer: Self-pay | Admitting: Internal Medicine

## 2014-04-14 ENCOUNTER — Ambulatory Visit (INDEPENDENT_AMBULATORY_CARE_PROVIDER_SITE_OTHER): Payer: BC Managed Care – PPO | Admitting: Internal Medicine

## 2014-04-14 VITALS — BP 120/60 | HR 76 | Ht 71.0 in | Wt 201.5 lb

## 2014-04-14 DIAGNOSIS — Z8679 Personal history of other diseases of the circulatory system: Secondary | ICD-10-CM | POA: Insufficient documentation

## 2014-04-14 DIAGNOSIS — R002 Palpitations: Secondary | ICD-10-CM

## 2014-04-14 DIAGNOSIS — R0602 Shortness of breath: Secondary | ICD-10-CM

## 2014-04-14 DIAGNOSIS — R0609 Other forms of dyspnea: Secondary | ICD-10-CM

## 2014-04-14 DIAGNOSIS — R0989 Other specified symptoms and signs involving the circulatory and respiratory systems: Secondary | ICD-10-CM

## 2014-04-14 DIAGNOSIS — Z3490 Encounter for supervision of normal pregnancy, unspecified, unspecified trimester: Secondary | ICD-10-CM

## 2014-04-14 DIAGNOSIS — Z331 Pregnant state, incidental: Secondary | ICD-10-CM

## 2014-04-14 NOTE — Patient Instructions (Addendum)
Your physician has requested that you have an echocardiogram. Echocardiography is a painless test that uses sound waves to create images of your heart. It provides your doctor with information about the size and shape of your heart and how well your heart's chambers and valves are working. This procedure takes approximately one hour. There are no restrictions for this procedure.  We will call you with the results.   Please follow up as needed and do not hesitate to contact Dr. Rennis GoldenHilty as your pregnancy progresses/if you have any questions.

## 2014-04-14 NOTE — Progress Notes (Signed)
OFFICE NOTE  Chief Complaint:  Palpitations, DOE, pregnant  Primary Care Physician: Christina AlkenBARNES,Christina STEWART, MD  HPI:  Christina Griffin is a pleasant 33 year old female who is currently 5 and half months pregnant. She is referred to me for evaluation of palpitations and shortness of breath with exertion. She reports she has a history of palpitations that go back many years. Chest is a history of orthostatic hypotension. She has passed out several times over the years but has had a clear prodrome and some warning prior to those episodes. She recalls back in 2006 or 2007 that [place] which was [place], except it was noted that she had mitral valve prolapse. She reported her palpitations were fairly infrequent in the past however have come more frequently and have lasted longer during her pregnancy. Fortunately she has not had any syncopal episodes. She does report shortness of breath which is worse with exertion. She also has a small amount of lower extremity swelling.  PMHx:  Past Medical History  Diagnosis Date  . Seasonal allergies   . Eczema     lips  . Chronic cholecystitis 06/2013  . Gallbladder polyp 06/2013  . Asthma     prn inhaler - no use in 2 mos.  . Dental crowns present   . Chronic SI joint pain   . MVP (mitral valve prolapse)     Past Surgical History  Procedure Laterality Date  . Wisdom tooth extraction      as a teenager  . Cholecystectomy N/A 07/18/2013    Procedure: LAPAROSCOPIC CHOLECYSTECTOMY WITH INTRAOPERATIVE CHOLANGIOGRAM;  Surgeon: Christina MentionHaywood M Ingram, MD;  Location: [facility];  Service: General;  Laterality: N/A;    FAMHx:  Family History  Problem Relation Age of Onset  . Breast cancer Paternal Aunt     breast  . Congenital heart disease Father     pig valve (2007-2008)  . Asthma Mother   . Heart disease Maternal Grandmother     SOCHx:   reports that she quit smoking about  13 years ago. She has never used smokeless tobacco. She reports that she drinks alcohol. She reports that she does not use illicit drugs.  ALLERGIES:  No Known Allergies  ROS: A comprehensive review of systems was negative except for: Constitutional: positive for fatigue Respiratory: positive for dyspnea on exertion Cardiovascular: positive for lower extremity edema and palpitations  HOME MEDS: Current Outpatient Prescriptions  Medication Sig Dispense Refill  . albuterol (PROVENTIL HFA;VENTOLIN HFA) 108 (90 BASE) MCG/ACT inhaler Inhale 2 puffs into the lungs every 6 (six) hours as needed for wheezing.      . cetirizine (ZYRTEC) 10 MG tablet Take 10 mg by mouth daily.      Marland Kitchen. desonide (DESONATE) 0.05 % gel Apply topically 2 (two) times daily.      . Prenatal Vit-Fe Fumarate-FA (PRENATAL MULTIVITAMIN) TABS tablet Take 1 tablet by mouth daily at 12 noon.       No current facility-administered medications for this visit.    LABS/IMAGING: No results found for this or any previous visit (from the past 48 hour(s)). No results found.  VITALS: BP 120/60  Pulse 76  Ht 5\' 11"  (1.803 m)  Wt 201 lb 8 oz (91.4 kg)  BMI 28.12 kg/m2  EXAM: General appearance: alert and no distress Neck: no carotid bruit and no JVD Lungs: clear to auscultation bilaterally Heart: regular rate and rhythm, S1, S2 normal, no murmur, click,  rub or gallop Abdomen: gravid uterus Extremities: extremities normal, atraumatic, no cyanosis or edema Pulses: 2+ and symmetric Skin: Skin color, texture, turgor normal. No rashes or lesions Neurologic: Grossly normal Psych: Mood, affect normal  EKG: Normal sinus rhythm at 76  ASSESSMENT: 1. Palpitations 2. Dyspnea on exertion 3. History of mitral valve prolapse 4. 5 1/2 months pregnant  PLAN: 1.   Mrs. Christina LindauBruner de Griffin has been experiencing an increase in her palpitations during her pregnancy. There is a questionable history of mitral valve prolapse however her  physical exam is not significant for prolapse. She has reported some increasing shortness of breath and I would like to obtain an echocardiogram to further evaluate this. I believe a lot of her symptoms may simply be due to the physiologic changes of pregnancy. Her palpitations which were pre-existing certainly can be worse during pregnancy and she is of course avoiding caffeine and stimulants. At this point I do not feel that they're significant enough to warrant treatment and she does not want to be on any medications. If her LV function is normal without any significant valvular disease I would recommend monitoring her throughout pregnancy and if her symptoms worsen, I would be happy to see her back at which time we will place her on a monitor and possibly consider beta blocker therapy.  Thank you again for the referral. Please let me know if I can be of further assistance.  Chrystie NoseKenneth C. Hilty, MD, [facility] Attending Cardiologist [facility]  HILTY,Kenneth C 04/14/2014, 3:29 PM

## 2014-04-27 ENCOUNTER — Inpatient Hospital Stay (HOSPITAL_COMMUNITY): Admission: RE | Admit: 2014-04-27 | Payer: BC Managed Care – PPO | Source: Ambulatory Visit

## 2014-05-10 IMAGING — RF DG CHOLANGIOGRAM OPERATIVE
1 series · 4 of 4 positions shown · non-contrast
Comparison: None.

CLINICAL DATA: Cholecystitis

EXAM:
INTRAOPERATIVE CHOLANGIOGRAM
TECHNIQUE: Cholangiographic images from the C-arm fluoroscopic device were
submitted for interpretation post-operatively. Please see the
procedural report for the amount of contrast and the fluoroscopy
time utilized.

[Series 1: run · 4 of 82 frames shown]
[frame 13/82]
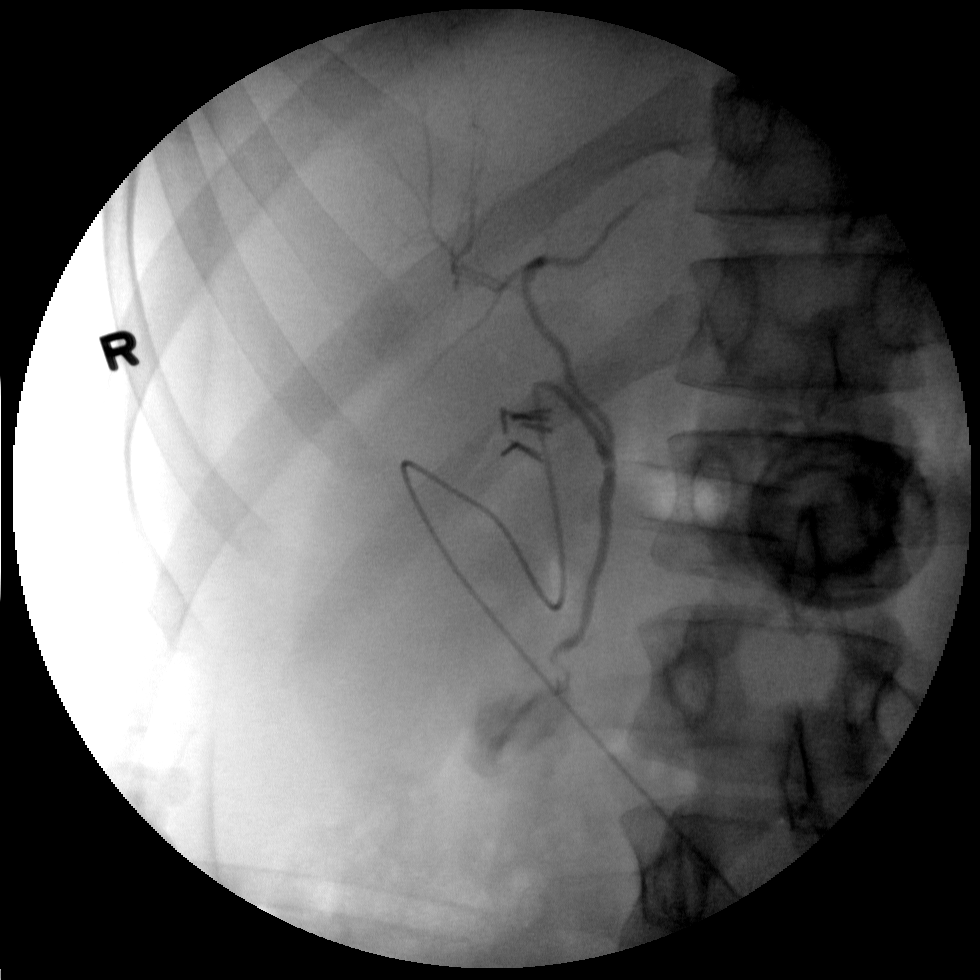
[frame 42/82]
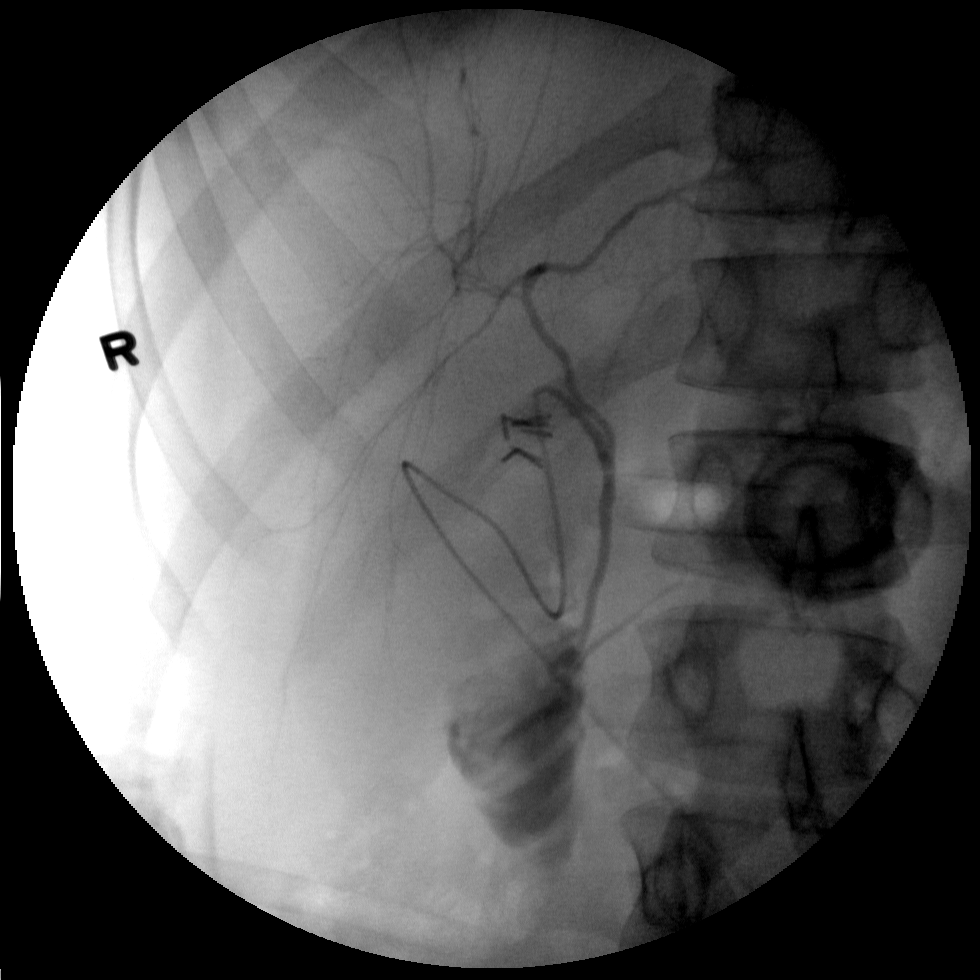
[frame 70/82]
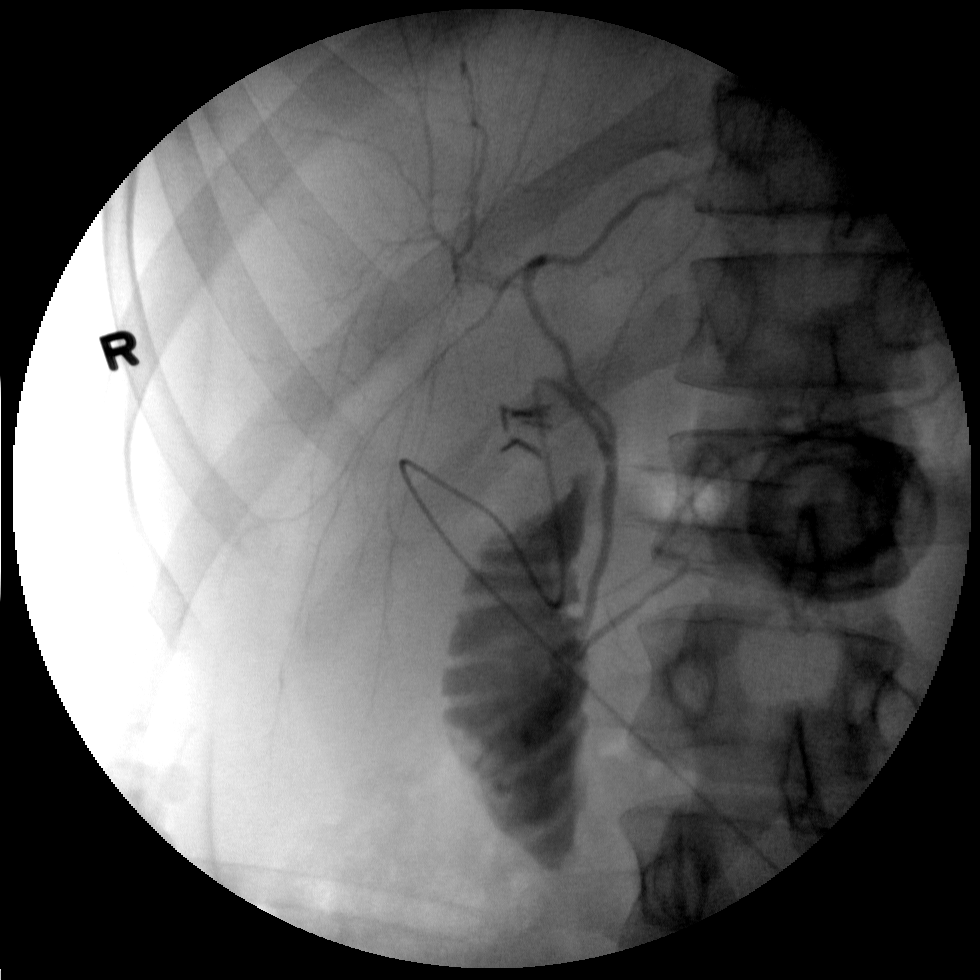
[frame 80/82]
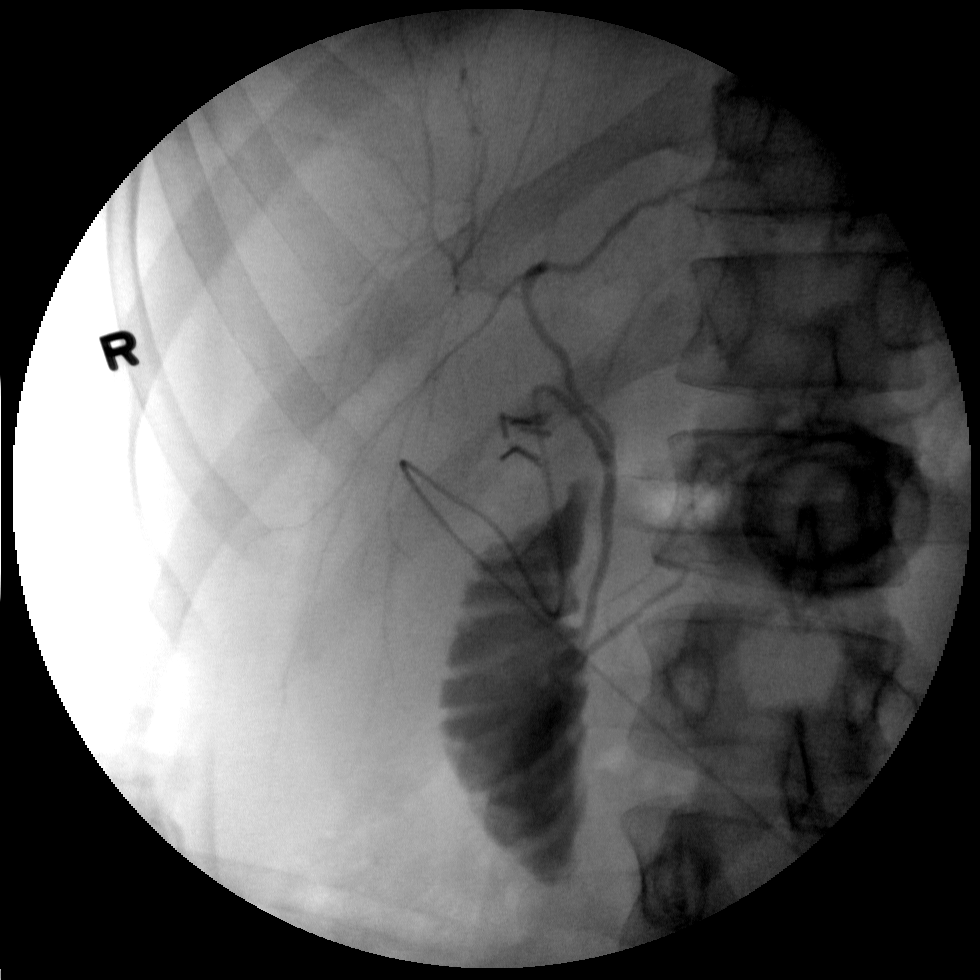

[4 of 4 positions shown; findings below may reference images not displayed]

FINDINGS: Gallbladder has been removed, and the cystic duct has been
cannulated. Portions of the intrahepatic biliary ductal system are
not visualized. Visualized intrahepatic ducts appear normal. Common
hepatic and common bile ducts appear within normal limits. No mass
or calculus seen. There is apparent free flow of contrast via the
common bile duct into the duodenum.
IMPRESSION: No obstructing lesions are identified. No calculi or mass is
appreciable.

## 2014-05-26 LAB — OB RESULTS CONSOLE RPR: RPR: NONREACTIVE

## 2014-05-26 LAB — OB RESULTS CONSOLE HIV ANTIBODY (ROUTINE TESTING): HIV: NONREACTIVE

## 2014-05-28 ENCOUNTER — Other Ambulatory Visit: Payer: Self-pay | Admitting: *Deleted

## 2014-05-28 DIAGNOSIS — R002 Palpitations: Secondary | ICD-10-CM

## 2014-05-28 DIAGNOSIS — R0602 Shortness of breath: Secondary | ICD-10-CM

## 2014-05-28 DIAGNOSIS — Z3490 Encounter for supervision of normal pregnancy, unspecified, unspecified trimester: Secondary | ICD-10-CM

## 2014-05-29 ENCOUNTER — Other Ambulatory Visit (HOSPITAL_COMMUNITY): Payer: Self-pay | Admitting: Internal Medicine

## 2014-05-29 ENCOUNTER — Ambulatory Visit (HOSPITAL_COMMUNITY)
Admission: RE | Admit: 2014-05-29 | Discharge: 2014-05-29 | Disposition: A | Discharge status: Home / Self Care | Payer: BC Managed Care – PPO | Source: Ambulatory Visit | Attending: Cardiology | Admitting: Cardiology

## 2014-05-29 DIAGNOSIS — R0609 Other forms of dyspnea: Secondary | ICD-10-CM | POA: Diagnosis not present

## 2014-05-29 DIAGNOSIS — R079 Chest pain, unspecified: Secondary | ICD-10-CM

## 2014-05-29 DIAGNOSIS — R0989 Other specified symptoms and signs involving the circulatory and respiratory systems: Secondary | ICD-10-CM | POA: Diagnosis not present

## 2014-05-29 DIAGNOSIS — I059 Rheumatic mitral valve disease, unspecified: Secondary | ICD-10-CM

## 2014-05-29 NOTE — Progress Notes (Signed)
2D Echocardiogram Complete.  05/29/2014   Jadakiss Barish, RDCS  

## 2014-06-03 ENCOUNTER — Telehealth: Payer: Self-pay | Admitting: Internal Medicine

## 2014-06-03 NOTE — Telephone Encounter (Signed)
Patient notified of echo results

## 2014-06-03 NOTE — Telephone Encounter (Signed)
Pt stated that she was returning Jenna's phone call about the results to an echo that was recently done. Please call  Thanks

## 2014-06-03 NOTE — Progress Notes (Signed)
LMTCB for echo results 

## 2014-06-03 NOTE — Telephone Encounter (Signed)
Deferred to  Jenna  

## 2014-07-22 LAB — OB RESULTS CONSOLE GBS: STREP GROUP B AG: NEGATIVE

## 2014-08-17 ENCOUNTER — Inpatient Hospital Stay (HOSPITAL_COMMUNITY)
Admission: AD | Admit: 2014-08-17 | Discharge: 2014-08-19 | DRG: 774 | Disposition: A | Discharge status: Home / Self Care | Payer: BC Managed Care – PPO | Source: Ambulatory Visit | Attending: Obstetrics and Gynecology | Admitting: Obstetrics and Gynecology

## 2014-08-17 ENCOUNTER — Encounter (HOSPITAL_COMMUNITY): Payer: Self-pay | Admitting: *Deleted

## 2014-08-17 ENCOUNTER — Telehealth: Payer: Self-pay | Admitting: Obstetrics and Gynecology

## 2014-08-17 ENCOUNTER — Inpatient Hospital Stay (HOSPITAL_COMMUNITY): Payer: BC Managed Care – PPO | Admitting: Anesthesiology

## 2014-08-17 DIAGNOSIS — O3663X0 Maternal care for excessive fetal growth, third trimester, not applicable or unspecified: Secondary | ICD-10-CM | POA: Diagnosis present

## 2014-08-17 DIAGNOSIS — O480 Post-term pregnancy: Secondary | ICD-10-CM | POA: Diagnosis present

## 2014-08-17 DIAGNOSIS — O9942 Diseases of the circulatory system complicating childbirth: Secondary | ICD-10-CM | POA: Diagnosis present

## 2014-08-17 DIAGNOSIS — Z3A40 40 weeks gestation of pregnancy: Secondary | ICD-10-CM | POA: Diagnosis present

## 2014-08-17 DIAGNOSIS — Z3403 Encounter for supervision of normal first pregnancy, third trimester: Secondary | ICD-10-CM | POA: Diagnosis present

## 2014-08-17 DIAGNOSIS — Z87891 Personal history of nicotine dependence: Secondary | ICD-10-CM

## 2014-08-17 DIAGNOSIS — I341 Nonrheumatic mitral (valve) prolapse: Secondary | ICD-10-CM | POA: Diagnosis present

## 2014-08-17 DIAGNOSIS — J45909 Unspecified asthma, uncomplicated: Secondary | ICD-10-CM | POA: Diagnosis present

## 2014-08-17 DIAGNOSIS — O4200 Premature rupture of membranes, onset of labor within 24 hours of rupture, unspecified weeks of gestation: Secondary | ICD-10-CM | POA: Diagnosis present

## 2014-08-17 HISTORY — DX: Herpesviral infection of urogenital system, unspecified: A60.00

## 2014-08-17 LAB — CBC
HEMATOCRIT: 38 % (ref 36.0–46.0)
HEMOGLOBIN: 12.8 g/dL (ref 12.0–15.0)
MCH: 30.3 pg (ref 26.0–34.0)
MCHC: 33.7 g/dL (ref 30.0–36.0)
MCV: 89.8 fL (ref 78.0–100.0)
Platelets: 207 10*3/uL (ref 150–400)
RBC: 4.23 MIL/uL (ref 3.87–5.11)
RDW: 14.4 % (ref 11.5–15.5)
WBC: 15.5 10*3/uL — ABNORMAL HIGH (ref 4.0–10.5)

## 2014-08-17 LAB — TYPE AND SCREEN
ABO/RH(D): A POS
Antibody Screen: NEGATIVE

## 2014-08-17 LAB — RPR

## 2014-08-17 LAB — ABO/RH: ABO/RH(D): A POS

## 2014-08-17 LAB — POCT FERN TEST: POCT FERN TEST: POSITIVE

## 2014-08-17 MED ORDER — WITCH HAZEL-GLYCERIN EX PADS: 1.0000 "application " | MEDICATED_PAD | CUTANEOUS | Status: DC | PRN

## 2014-08-17 MED ORDER — LORATADINE 10 MG PO TABS
10.0000 mg | ORAL_TABLET | Freq: Every day | ORAL | Status: DC
  Filled 2014-08-17 (×2): qty 1

## 2014-08-17 MED ORDER — SENNOSIDES-DOCUSATE SODIUM 8.6-50 MG PO TABS
2.0000 | ORAL_TABLET | ORAL | Status: DC
  Administered 2014-08-18 (×2): 2 via ORAL
  Filled 2014-08-17 (×2): qty 2

## 2014-08-17 MED ORDER — OXYTOCIN 40 UNITS IN LACTATED RINGERS INFUSION - SIMPLE MED
1.0000 m[IU]/min | INTRAVENOUS | Status: DC
Start: 2014-08-17 — End: 2014-08-17
  Administered 2014-08-17: 2 m[IU]/min via INTRAVENOUS
  Filled 2014-08-17: qty 1000

## 2014-08-17 MED ORDER — LACTATED RINGERS IV SOLN: 500.0000 mL | Freq: Once | INTRAVENOUS | Status: DC

## 2014-08-17 MED ORDER — IBUPROFEN 600 MG PO TABS
600.0000 mg | ORAL_TABLET | Freq: Four times a day (QID) | ORAL | Status: DC
  Administered 2014-08-17 – 2014-08-19 (×6): 600 mg via ORAL
  Filled 2014-08-17 (×6): qty 1

## 2014-08-17 MED ORDER — OXYCODONE-ACETAMINOPHEN 5-325 MG PO TABS: 2.0000 | ORAL_TABLET | ORAL | Status: DC | PRN

## 2014-08-17 MED ORDER — ONDANSETRON HCL 4 MG/2ML IJ SOLN: 4.0000 mg | INTRAMUSCULAR | Status: DC | PRN

## 2014-08-17 MED ORDER — METHYLERGONOVINE MALEATE 0.2 MG PO TABS
0.2000 mg | ORAL_TABLET | ORAL | Status: DC | PRN
Start: 2014-08-17 — End: 2014-08-19

## 2014-08-17 MED ORDER — FERROUS SULFATE 325 (65 FE) MG PO TABS
325.0000 mg | ORAL_TABLET | Freq: Two times a day (BID) | ORAL | Status: DC
  Administered 2014-08-18 – 2014-08-19 (×3): 325 mg via ORAL
  Filled 2014-08-17 (×3): qty 1

## 2014-08-17 MED ORDER — PHENYLEPHRINE 40 MCG/ML (10ML) SYRINGE FOR IV PUSH (FOR BLOOD PRESSURE SUPPORT)
PREFILLED_SYRINGE | INTRAVENOUS | Status: AC
  Filled 2014-08-17: qty 10

## 2014-08-17 MED ORDER — ONDANSETRON HCL 4 MG PO TABS: 4.0000 mg | ORAL_TABLET | ORAL | Status: DC | PRN

## 2014-08-17 MED ORDER — DIPHENHYDRAMINE HCL 50 MG/ML IJ SOLN: 12.5000 mg | INTRAMUSCULAR | Status: DC | PRN

## 2014-08-17 MED ORDER — CITRIC ACID-SODIUM CITRATE 334-500 MG/5ML PO SOLN: 30.0000 mL | ORAL | Status: DC | PRN

## 2014-08-17 MED ORDER — FENTANYL 2.5 MCG/ML BUPIVACAINE 1/10 % EPIDURAL INFUSION (WH - ANES)
INTRAMUSCULAR | Status: AC
  Administered 2014-08-17: 14 mL/h via EPIDURAL
  Filled 2014-08-17: qty 125

## 2014-08-17 MED ORDER — ONDANSETRON HCL 4 MG/2ML IJ SOLN: 4.0000 mg | Freq: Four times a day (QID) | INTRAMUSCULAR | Status: DC | PRN

## 2014-08-17 MED ORDER — OXYCODONE-ACETAMINOPHEN 5-325 MG PO TABS
1.0000 | ORAL_TABLET | ORAL | Status: DC | PRN
  Administered 2014-08-18 – 2014-08-19 (×2): 1 via ORAL
  Filled 2014-08-17 (×2): qty 1

## 2014-08-17 MED ORDER — LANOLIN HYDROUS EX OINT
TOPICAL_OINTMENT | CUTANEOUS | Status: DC | PRN
Start: 2014-08-17 — End: 2014-08-19

## 2014-08-17 MED ORDER — LIDOCAINE HCL (PF) 1 % IJ SOLN
INTRAMUSCULAR | Status: DC | PRN
  Administered 2014-08-17: 6 mL
  Administered 2014-08-17: 4 mL

## 2014-08-17 MED ORDER — SIMETHICONE 80 MG PO CHEW
80.0000 mg | CHEWABLE_TABLET | ORAL | Status: DC | PRN
Start: 2014-08-17 — End: 2014-08-19

## 2014-08-17 MED ORDER — PHENYLEPHRINE 40 MCG/ML (10ML) SYRINGE FOR IV PUSH (FOR BLOOD PRESSURE SUPPORT)
80.0000 ug | PREFILLED_SYRINGE | INTRAVENOUS | Status: DC | PRN
  Filled 2014-08-17: qty 2

## 2014-08-17 MED ORDER — ACETAMINOPHEN 325 MG PO TABS: 650.0000 mg | ORAL_TABLET | ORAL | Status: DC | PRN

## 2014-08-17 MED ORDER — LACTATED RINGERS IV SOLN: 500.0000 mL | INTRAVENOUS | Status: DC | PRN

## 2014-08-17 MED ORDER — LACTATED RINGERS IV SOLN
INTRAVENOUS | Status: DC
Start: 2014-08-17 — End: 2014-08-17
  Administered 2014-08-17: 15:00:00 via INTRAVENOUS

## 2014-08-17 MED ORDER — FENTANYL 2.5 MCG/ML BUPIVACAINE 1/10 % EPIDURAL INFUSION (WH - ANES): 16.0000 mL/h | INTRAMUSCULAR | Status: DC | PRN

## 2014-08-17 MED ORDER — OXYCODONE-ACETAMINOPHEN 5-325 MG PO TABS
1.0000 | ORAL_TABLET | ORAL | Status: DC | PRN
  Administered 2014-08-17: 1 via ORAL
  Filled 2014-08-17: qty 1

## 2014-08-17 MED ORDER — ZOLPIDEM TARTRATE 5 MG PO TABS: 5.0000 mg | ORAL_TABLET | Freq: Every evening | ORAL | Status: DC | PRN

## 2014-08-17 MED ORDER — DIBUCAINE 1 % RE OINT: 1.0000 "application " | TOPICAL_OINTMENT | RECTAL | Status: DC | PRN

## 2014-08-17 MED ORDER — OXYTOCIN 40 UNITS IN LACTATED RINGERS INFUSION - SIMPLE MED
62.5000 mL/h | INTRAVENOUS | Status: DC
  Administered 2014-08-17: 62.5 mL/h via INTRAVENOUS

## 2014-08-17 MED ORDER — EPHEDRINE 5 MG/ML INJ
10.0000 mg | INTRAVENOUS | Status: DC | PRN
  Filled 2014-08-17: qty 2

## 2014-08-17 MED ORDER — LIDOCAINE HCL (PF) 1 % IJ SOLN
30.0000 mL | INTRAMUSCULAR | Status: DC | PRN
  Filled 2014-08-17: qty 30

## 2014-08-17 MED ORDER — BUTORPHANOL TARTRATE 1 MG/ML IJ SOLN: 1.0000 mg | INTRAMUSCULAR | Status: DC | PRN

## 2014-08-17 MED ORDER — TERBUTALINE SULFATE 1 MG/ML IJ SOLN: 0.2500 mg | Freq: Once | INTRAMUSCULAR | Status: DC | PRN

## 2014-08-17 MED ORDER — FENTANYL 2.5 MCG/ML BUPIVACAINE 1/10 % EPIDURAL INFUSION (WH - ANES)
14.0000 mL/h | INTRAMUSCULAR | Status: DC | PRN
  Administered 2014-08-17: 14 mL/h via EPIDURAL

## 2014-08-17 MED ORDER — METHYLERGONOVINE MALEATE 0.2 MG/ML IJ SOLN: 0.2000 mg | INTRAMUSCULAR | Status: DC | PRN

## 2014-08-17 MED ORDER — BENZOCAINE-MENTHOL 20-0.5 % EX AERO
1.0000 "application " | INHALATION_SPRAY | CUTANEOUS | Status: DC | PRN
  Filled 2014-08-17: qty 56

## 2014-08-17 MED ORDER — OXYTOCIN BOLUS FROM INFUSION: 500.0000 mL | INTRAVENOUS | Status: DC

## 2014-08-17 MED ORDER — ALBUTEROL SULFATE (2.5 MG/3ML) 0.083% IN NEBU: 3.0000 mL | INHALATION_SOLUTION | Freq: Four times a day (QID) | RESPIRATORY_TRACT | Status: DC | PRN

## 2014-08-17 MED ORDER — PRENATAL MULTIVITAMIN CH
1.0000 | ORAL_TABLET | Freq: Every day | ORAL | Status: DC
  Administered 2014-08-18: 1 via ORAL
  Filled 2014-08-17: qty 1

## 2014-08-17 MED ORDER — FENTANYL 2.5 MCG/ML BUPIVACAINE 1/10 % EPIDURAL INFUSION (WH - ANES)
INTRAMUSCULAR | Status: DC | PRN
  Administered 2014-08-17: 16 mL/h via EPIDURAL

## 2014-08-17 MED ORDER — DIPHENHYDRAMINE HCL 25 MG PO CAPS: 25.0000 mg | ORAL_CAPSULE | Freq: Four times a day (QID) | ORAL | Status: DC | PRN

## 2014-08-17 NOTE — Plan of Care (Signed)
Problem: Phase II Progression Outcomes Goal: Tolerating diet Outcome: Completed/Met Date Met:  08/17/14     

## 2014-08-17 NOTE — Plan of Care (Signed)
Problem: Phase I Progression Outcomes Goal: Initial discharge plan identified Outcome: Completed/Met Date Met:  08/17/14     

## 2014-08-17 NOTE — MAU Note (Signed)
Pt presents to MAU with complaints contractions that started this morning around 4 am. Reports some clear leakage of fluid that started around 11 last night.

## 2014-08-17 NOTE — Telephone Encounter (Signed)
TC from this 33 yo G1P0 at 40 wks c/o possible LOF since 3 am today. States up to void and noticed a small trickle of clear, non-odorous fluid running down one leg. She reported good fetal movement and denied VB or ctxs. Advised pt to place sanitary napkin and monitor closely over next hour. She was instructed to contact this provider at that time re: status.  At approx 3:50 am, pt  returned call. States not actively leaking, but did notice saturated mini pad.    Offered pt evaluation in MAU. States since not actively contracting or laboring, will stay home and f/u in office today, sooner if indicated. Reports negative GBS.   Strict labor precautions reviewed.   Sherre ScarletKimberly Sharra Cayabyab, CNM 08/17/14, 9:39 AM

## 2014-08-17 NOTE — Telephone Encounter (Signed)
Duplicate entry  Pt was advised to come to MAU for ROM check, but stated she wanted to wait to be evaluated by her provider.

## 2014-08-17 NOTE — Plan of Care (Signed)
Problem: Phase I Progression Outcomes Goal: Voiding adequately Outcome: Completed/Met Date Met:  08/17/14

## 2014-08-17 NOTE — Anesthesia Preprocedure Evaluation (Signed)
Anesthesia Evaluation  Patient identified by MRN, date of birth, ID band Patient awake    Reviewed: Allergy & Precautions, H&P , Patient's Chart, lab work & pertinent test results  Airway Mallampati: II  TM Distance: >3 FB Neck ROM: full    Dental  (+) Teeth Intact   Pulmonary asthma (last used 2 months ago, rare usage) , former smoker,  breath sounds clear to auscultation        Cardiovascular Rhythm:regular Rate:Normal     Neuro/Psych    GI/Hepatic   Endo/Other    Renal/GU      Musculoskeletal   Abdominal   Peds  Hematology   Anesthesia Other Findings       Reproductive/Obstetrics (+) Pregnancy                             Anesthesia Physical Anesthesia Plan  ASA: II  Anesthesia Plan: Epidural   Post-op Pain Management:    Induction:   Airway Management Planned:   Additional Equipment:   Intra-op Plan:   Post-operative Plan:   Informed Consent: I have reviewed the patients History and Physical, chart, labs and discussed the procedure including the risks, benefits and alternatives for the proposed anesthesia with the patient or authorized representative who has indicated his/her understanding and acceptance.   Dental Advisory Given  Plan Discussed with:   Anesthesia Plan Comments: (Labs checked- platelets confirmed with RN in room. Fetal heart tracing, per RN, reported to be stable enough for sitting procedure. Discussed epidural, and patient consents to the procedure:  included risk of possible headache,backache, failed block, allergic reaction, and nerve injury. This patient was asked if she had any questions or concerns before the procedure started.)        Anesthesia Quick Evaluation

## 2014-08-17 NOTE — H&P (Addendum)
Christina Griffin is a 33 y.o. female G1 at 10740 0/7 weeks c/w 6 week ultrasound presenting for c/o leaking fluid.  Stated at 11:30 pm, yesterday, ~ 12 hours prior to admission.  Pt started having a few contractions overnight and upon arrival. Denied fever/chills.  Pt desires epidural. H/o HSV 2+ .  Has been on Valtrex.  Denies any abnormal lesions or prodromal symptoms.   Maternal Medical History:  Reason for admission: Rupture of membranes.   Contractions: Onset was 6-12 hours ago.   Frequency: regular.   Perceived severity is strong.    Fetal activity: Perceived fetal activity is normal.    Prenatal complications: no prenatal complications Prenatal Complications - Diabetes: none.    OB History    Gravida Para Term Preterm AB TAB SAB Ectopic Multiple Living   1              Past Medical History  Diagnosis Date  . Seasonal allergies   . Eczema     lips  . Chronic cholecystitis 06/2013  . Gallbladder polyp 06/2013  . Asthma     prn inhaler - no use in 2 mos.  . Dental crowns present   . Chronic SI joint pain   . MVP (mitral valve prolapse)   . Genital herpes    Past Surgical History  Procedure Laterality Date  . Wisdom tooth extraction      as a teenager  . Cholecystectomy N/A 07/18/2013    Procedure: LAPAROSCOPIC CHOLECYSTECTOMY WITH INTRAOPERATIVE CHOLANGIOGRAM;  Surgeon: Christina MentionHaywood M Ingram, MD;  Location: [facility];  Service: General;  Laterality: N/A;   Family History: family history includes Asthma in her mother; Breast cancer in her paternal aunt; Congenital heart disease in her father; Heart disease in her maternal grandmother. Social History:  reports that she quit smoking about 13 years ago. She has never used smokeless tobacco. She reports that she does not drink alcohol or use illicit drugs.   Prenatal Transfer Tool  Maternal Diabetes: No Genetic Screening: Normal Maternal Ultrasounds/Referrals: Normal Fetal Ultrasounds or other  Referrals:  None Maternal Substance Abuse:  No Significant Maternal Medications:  Meds include: Other: Valtrex Significant Maternal Lab Results:  Lab values include: Group B Strep negative Other Comments:  None  Review of Systems  Gastrointestinal: Positive for abdominal pain.  Genitourinary:       Denies vulvar lesions.    Dilation: 7.5 Effacement (%): 90 Station: 0 Exam by:: Christina Griffin Blood pressure 112/56, pulse 84, temperature 98.6 F (37 C), temperature source Oral, resp. rate 20, height 5\' 11"  (1.803 m), weight 104.327 kg (230 lb). Maternal Exam:  Uterine Assessment: Contraction strength is firm.  Contraction frequency is regular.   Abdomen: Estimated fetal weight is 8.5-9 lbs.   Fetal presentation: vertex  Introitus: Normal vulva. Normal vagina.  Ferning test: positive.  Amniotic fluid character: clear.  Pelvis: adequate for delivery.   Cervix: Cervix evaluated by sterile speculum exam and digital exam.     Fetal Exam Fetal Monitor Review: Mode: fetoscope.   Variability: moderate (6-25 bpm).   Pattern: accelerations present.    Fetal State Assessment: Category I - tracings are normal.     Physical Exam  Constitutional: She is oriented to person, place, and time. She appears well-developed and well-nourished. She appears distressed.  HENT:  Head: Normocephalic and atraumatic.  Eyes: EOM are normal.  Neck: Normal range of motion.  Cardiovascular: Normal rate, regular rhythm and normal heart sounds.  Respiratory: Effort normal and breath sounds normal. No respiratory distress. She has no wheezes.  GI: There is no tenderness.  Genitourinary: Vagina normal and uterus normal.  Musculoskeletal: Normal range of motion. She exhibits edema.  Neurological: She is alert and oriented to person, place, and time.  Skin: Skin is warm and dry.  Psychiatric: She has a normal mood and affect.    Prenatal labs: ABO, Rh: --/--/A POS (11/02 1215) Antibody: NEG (11/02  1215) Rubella: Immune (03/24 0000) RPR: NON REAC (11/02 1215)  HBsAg:    HIV: Non-reactive (08/11 0000)  GBS: Negative (10/07 0000)   Assessment/Plan: IUP at 40 0/7 weeks.  PROM now in active labor on 6 mUs of Pitocin.  No s/sxs of chorioamnionitis. LGA with adequate pelvis. 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 Epidural per anesthesia. Anticipate SVD.  Christina Griffin now covering.  Has been updated on advanced dilatation.  Christina Griffin, Christina Griffin 08/17/2014, 3:05 PM

## 2014-08-17 NOTE — Plan of Care (Signed)
Problem: Phase II Progression Outcomes Goal: Progress activity as tolerated unless otherwise ordered Outcome: Completed/Met Date Met:  08/17/14

## 2014-08-17 NOTE — Plan of Care (Signed)
Problem: Phase I Progression Outcomes Goal: IV Pain medications as ordered Outcome: Not Applicable Date Met:  68/11/57

## 2014-08-17 NOTE — Plan of Care (Signed)
Problem: Phase I Progression Outcomes Goal: Foley catheter patent Outcome: Not Applicable Date Met:  94/83/47

## 2014-08-17 NOTE — Anesthesia Procedure Notes (Signed)
Epidural Patient location during procedure: OB  Preanesthetic Checklist Completed: patient identified, site marked, surgical consent, pre-op evaluation, timeout performed, IV checked, risks and benefits discussed and monitors and equipment checked  Epidural Patient position: sitting Prep: site prepped and draped and DuraPrep Patient monitoring: continuous pulse ox and blood pressure Approach: midline Location: L3-L4 Injection technique: LOR air  Needle:  Needle type: Tuohy  Needle gauge: 17 G Needle length: 9 cm and 9 Needle insertion depth: 6 cm Catheter type: closed end flexible Catheter size: 19 Gauge Catheter at skin depth: 11 cm Test dose: negative  Assessment Events: blood not aspirated, injection not painful, no injection resistance, negative IV test and no paresthesia  Additional Notes Dosing of Epidural:  1st dose, through catheter ............................................Marland Kitchen.  Xylocaine 40 mg  2nd dose, through catheter, after waiting 3 minutes........Marland Kitchen.Xylocaine 60 mg    ( 1% Xylo charted as a single dose in Epic Meds for ease of charting; actual dosing was fractionated as above, for saftey's sake)  As each dose occurred, patient was free of IV sx; and patient exhibited no evidence of SA injection.  Patient is more comfortable after epidural dosed. Please see RN's note for documentation of vital signs,and FHR which are stable.  Patient reminded not to try to ambulate with numb legs, and that an RN must be present when she attempts to get up.

## 2014-08-18 LAB — CBC
HCT: 31.8 % — ABNORMAL LOW (ref 36.0–46.0)
Hemoglobin: 11.1 g/dL — ABNORMAL LOW (ref 12.0–15.0)
MCH: 31.5 pg (ref 26.0–34.0)
MCHC: 34.9 g/dL (ref 30.0–36.0)
MCV: 90.3 fL (ref 78.0–100.0)
PLATELETS: 164 10*3/uL (ref 150–400)
RBC: 3.52 MIL/uL — AB (ref 3.87–5.11)
RDW: 14.7 % (ref 11.5–15.5)
WBC: 17.1 10*3/uL — AB (ref 4.0–10.5)

## 2014-08-18 NOTE — Plan of Care (Signed)
Problem: Consults Goal: Postpartum Patient Education (See Patient Education module for education specifics.)  Outcome: Completed/Met Date Met:  08/18/14 Goal: Skin Care Protocol Initiated - if Braden Score 18 or less If consults are not indicated, leave blank or document N/A  Outcome: Not Applicable Date Met:  73/08/16 Goal: Nutrition Consult-if indicated Outcome: Not Applicable Date Met:  83/87/06 Goal: Diabetes Guidelines if Diabetic/Glucose > 140 If diabetic or lab glucose is > 140 mg/dl - Initiate Diabetes/Hyperglycemia Guidelines & Document Interventions  Outcome: Not Applicable Date Met:  58/26/08  Problem: Phase II Progression Outcomes Goal: Incision intact & without signs/symptoms of infection Outcome: Not Applicable Date Met:  88/35/84  Problem: Discharge Progression Outcomes Goal: Remove staples per MD order Outcome: Not Applicable Date Met:  46/52/07 Goal: MMR given as ordered Outcome: Not Applicable Date Met:  61/91/55

## 2014-08-18 NOTE — Lactation Note (Signed)
This note was copied from the chart of Christina Aurilla Jenean LindauBruner de Griffin. Lactation Consultation Note; Initial visit with mom Baby now 4917 hours old. Mom reports she has had some good feeding but the last couple of feeding she feels she is just too close to the nipple. Nipples look intact but pink. Baby awake and beginning to show feeding cues. Assisted with latch. Mom reports some pain with first few sucks. Bottom lip untucked and mom reports that feels better. Showed dad what to look for to assist mom. BF brochure given with resources for support after DC. No questions at present. To call prn  Patient Name: Christina Griffin Today's Date: 08/18/2014 Reason for consult: Initial assessment   Maternal Data Formula Feeding for Exclusion: No Has patient been taught Hand Expression?: Yes Does the patient have breastfeeding experience prior to this delivery?: No  Feeding Feeding Type: Breast Fed  LATCH Score/Interventions Latch: Grasps breast easily, tongue down, lips flanged, rhythmical sucking.  Audible Swallowing: A few with stimulation  Type of Nipple: Everted at rest and after stimulation  Comfort (Breast/Nipple): Soft / non-tender     Hold (Positioning): Assistance needed to correctly position infant at breast and maintain latch. Intervention(s): Breastfeeding basics reviewed;Support Pillows;Skin to skin  LATCH Score: 8  Lactation Tools Discussed/Used     Consult Status Consult Status: Follow-up Date: 08/19/14 Follow-up type: In-patient    Pamelia HoitWeeks, Mohamed Portlock D 08/18/2014, 10:41 AM

## 2014-08-18 NOTE — Anesthesia Postprocedure Evaluation (Signed)
  Anesthesia Post-op Note  Patient: Christina Griffin  Procedure(s) Performed: * No procedures listed *  Patient Location: Mother/Baby  Anesthesia Type:Epidural  Level of Consciousness: awake, alert , oriented and patient cooperative  Airway and Oxygen Therapy: Patient Spontanous Breathing  Post-op Pain: mild  Post-op Assessment: Post-op Vital signs reviewed, Patient's Cardiovascular Status Stable, Respiratory Function Stable, Patent Airway, No headache, No backache, No residual numbness and No residual motor weakness  Post-op Vital Signs: Reviewed and stable  Last Vitals:  Filed Vitals:   08/18/14 0015  BP: 110/60  Pulse: 79  Temp: 36.6 C  Resp: 18    Complications: No apparent anesthesia complications

## 2014-08-18 NOTE — Plan of Care (Signed)
Problem: Phase I Progression Outcomes Goal: Pain controlled with appropriate interventions Outcome: Completed/Met Date Met:  08/18/14 Goal: OOB as tolerated unless otherwise ordered Outcome: Completed/Met Date Met:  08/18/14 Goal: IS, TCDB as ordered Outcome: Not Applicable Date Met:  22/17/98 Goal: VS, stable, temp < 100.4 degrees F Outcome: Completed/Met Date Met:  08/18/14 Goal: Other Phase I Outcomes/Goals Outcome: Completed/Met Date Met:  08/18/14  Problem: Phase II Progression Outcomes Goal: Pain controlled on oral analgesia Outcome: Completed/Met Date Met:  08/18/14 Goal: Rh isoimmunization per orders Outcome: Not Applicable Date Met:  08/10/47

## 2014-08-18 NOTE — Progress Notes (Signed)
Postpartum day #1, NSVD  Subjective Pt without complaints.  Lochia normal.  Pain controlled.  Breast feeding yes  Temp:  [97.6 F (36.4 C)-98.6 F (37 C)] 98.1 F (36.7 C) (11/03 0819) Pulse Rate:  [71-100] 84 (11/03 0819) Resp:  [16-23] 16 (11/03 0819) BP: (107-137)/(55-94) 112/64 mmHg (11/03 0819) SpO2:  [98 %-99 %] 98 % (11/03 0819)  Gen:  NAD, A&O x 3 Uterine fundus:  Firm, nontender Lochia normal Ext: Edema present, no calf tenderness bilaterally  CBC    Component Value Date/Time   WBC 17.1* 08/18/2014 0600   RBC 3.52* 08/18/2014 0600   HGB 11.1* 08/18/2014 0600   HCT 31.8* 08/18/2014 0600   PLT 164 08/18/2014 0600   MCV 90.3 08/18/2014 0600   MCH 31.5 08/18/2014 0600   MCHC 34.9 08/18/2014 0600   RDW 14.7 08/18/2014 0600   LYMPHSABS 2.9 07/16/2013 1630   MONOABS 0.6 07/16/2013 1630   EOSABS 0.3 07/16/2013 1630   BASOSABS 0.0 07/16/2013 1630     A/P: S/p SVD doing well. Routine postpartum care. Lactation support. Discharge in am  Geryl RankinsVARNADO, Deerica Waszak 08/18/2014, 1:37 PM

## 2014-08-19 MED ORDER — IBUPROFEN 600 MG PO TABS: 600.0000 mg | ORAL_TABLET | Freq: Four times a day (QID) | ORAL | Status: DC

## 2014-08-19 MED ORDER — LANOLIN HYDROUS EX OINT: 1.0000 "application " | TOPICAL_OINTMENT | CUTANEOUS | Status: DC | PRN

## 2014-08-19 NOTE — Lactation Note (Signed)
This note was copied from the chart of Christina Jamar Jenean LindauBruner de Griffin. Lactation Consultation Note  Mother latched baby in cradle hold.  Suggest she relatch in cross cradle for more depth since mother says her nipples are sore. Reviewed applying ebm and comfort gels and a hand pump. Reviewed engorgement care and support group.  Patient Name: Christina Griffin ZOXWR'UToday's Date: 08/19/2014 Reason for consult: Follow-up assessment   Maternal Data    Feeding Feeding Type: Breast Fed Length of feed: 30 min  LATCH Score/Interventions Latch: Grasps breast easily, tongue down, lips flanged, rhythmical sucking.  Audible Swallowing: Spontaneous and intermittent  Type of Nipple: Everted at rest and after stimulation  Comfort (Breast/Nipple): Filling, red/small blisters or bruises, mild/mod discomfort  Problem noted: Mild/Moderate discomfort Interventions (Mild/moderate discomfort): Hand expression;Comfort gels  Hold (Positioning): Assistance needed to correctly position infant at breast and maintain latch.  LATCH Score: 8  Lactation Tools Discussed/Used     Consult Status Consult Status: Complete    Hardie PulleyBerkelhammer, Ruth Boschen 08/19/2014, 12:28 PM

## 2014-08-19 NOTE — Discharge Instructions (Addendum)
Postpartum Care After Vaginal Delivery  After you deliver your newborn (postpartum period), the usual stay in the hospital is 24-72 hours. If there were problems with your labor or delivery, or if you have other medical problems, you might be in the hospital longer.   While you are in the hospital, you will receive help and instructions on how to care for yourself and your newborn during the postpartum period.   While you are in the hospital:  · Be sure to tell your nurses if you have pain or discomfort, as well as where you feel the pain and what makes the pain worse.  · If you had an incision made near your vagina (episiotomy) or if you had some tearing during delivery, the nurses may put ice packs on your episiotomy or tear. The ice packs may help to reduce the pain and swelling.  · If you are breastfeeding, you may feel uncomfortable contractions of your uterus for a couple of weeks. This is normal. The contractions help your uterus get back to normal size.  · It is normal to have some bleeding after delivery.  ¨ For the first 1-3 days after delivery, the flow is red and the amount may be similar to a period.  ¨ It is common for the flow to start and stop.  ¨ In the first few days, you may pass some small clots. Let your nurses know if you begin to pass large clots or your flow increases.  ¨ Do not  flush blood clots down the toilet before having the nurse look at them.  ¨ During the next 3-10 days after delivery, your flow should become more watery and pink or brown-tinged in color.  ¨ Ten to fourteen days after delivery, your flow should be a small amount of yellowish-white discharge.  ¨ The amount of your flow will decrease over the first few weeks after delivery. Your flow may stop in 6-8 weeks. Most women have had their flow stop by 12 weeks after delivery.  · You should change your sanitary pads frequently.  · Wash your hands thoroughly with soap and water for at least 20 seconds after changing pads, using  the toilet, or before holding or feeding your newborn.  · You should feel like you need to empty your bladder within the first 6-8 hours after delivery.  · In case you become weak, lightheaded, or faint, call your nurse before you get out of bed for the first time and before you take a shower for the first time.  · Within the first few days after delivery, your breasts may begin to feel tender and full. This is called engorgement. Breast tenderness usually goes away within 48-72 hours after engorgement occurs. You may also notice milk leaking from your breasts. If you are not breastfeeding, do not stimulate your breasts. Breast stimulation can make your breasts produce more milk.  · Spending as much time as possible with your newborn is very important. During this time, you and your newborn can feel close and get to know each other. Having your newborn stay in your room (rooming in) will help to strengthen the bond with your newborn.  It will give you time to get to know your newborn and become comfortable caring for your newborn.  · Your hormones change after delivery. Sometimes the hormone changes can temporarily cause you to feel sad or tearful. These feelings should not last more than a few days. If these feelings last longer than   that, you should talk to your caregiver.  · If desired, talk to your caregiver about methods of family planning or contraception.  · Talk to your caregiver about immunizations. Your caregiver may want you to have the following immunizations before leaving the hospital:  ¨ Tetanus, diphtheria, and pertussis (Tdap) or tetanus and diphtheria (Td) immunization. It is very important that you and your family (including grandparents) or others caring for your newborn are up-to-date with the Tdap or Td immunizations. The Tdap or Td immunization can help protect your newborn from getting ill.  ¨ Rubella immunization.  ¨ Varicella (chickenpox) immunization.  ¨ Influenza immunization. You should  receive this annual immunization if you did not receive the immunization during your pregnancy.  Document Released: 07/30/2007 Document Revised: 06/26/2012 Document Reviewed: 05/29/2012  ExitCare® Patient Information ©2015 [employer]. This information is not intended to replace advice given to you by your health care provider. Make sure you discuss any questions you have with your health care provider.  Postpartum Depression and Baby Blues  The postpartum period begins right after the birth of a baby. During this time, there is often a great amount of joy and excitement. It is also a time of many changes in the life of the parents. Regardless of how many times a mother gives birth, each child brings new challenges and dynamics to the family. It is not unusual to have feelings of excitement along with confusing shifts in moods, emotions, and thoughts. All mothers are at risk of developing postpartum depression or the "baby blues." These mood changes can occur right after giving birth, or they may occur many months after giving birth. The baby blues or postpartum depression can be mild or severe. Additionally, postpartum depression can go away rather quickly, or it can be a long-term condition.   CAUSES  Raised hormone levels and the rapid drop in those levels are thought to be a main cause of postpartum depression and the baby blues. A number of hormones change during and after pregnancy. Estrogen and progesterone usually decrease right after the delivery of your baby. The levels of thyroid hormone and various cortisol steroids also rapidly drop. Other factors that play a role in these mood changes include major life events and genetics.   RISK FACTORS  If you have any of the following risks for the baby blues or postpartum depression, know what symptoms to watch out for during the postpartum period. Risk factors that may increase the likelihood of getting the baby blues or postpartum depression include:  · Having  a personal or family history of depression.    · Having depression while being pregnant.    · Having premenstrual mood issues or mood issues related to oral contraceptives.  · Having a lot of life stress.    · Having marital conflict.    · Lacking a social support network.    · Having a baby with special needs.    · Having health problems, such as diabetes.    SIGNS AND SYMPTOMS  Symptoms of baby blues include:  · Brief changes in mood, such as going from extreme happiness to sadness.  · Decreased concentration.    · Difficulty sleeping.    · Crying spells, tearfulness.    · Irritability.    · Anxiety.    Symptoms of postpartum depression typically begin within the first month after giving birth. These symptoms include:  · Difficulty sleeping or excessive sleepiness.    · Marked weight loss.    · Agitation.    · Feelings of worthlessness.    · Lack   of interest in activity or food.    Postpartum psychosis is a very serious condition and can be dangerous. Fortunately, it is rare. Displaying any of the following symptoms is cause for immediate medical attention. Symptoms of postpartum psychosis include:   · Hallucinations and delusions.    · Bizarre or disorganized behavior.    · Confusion or disorientation.    DIAGNOSIS   A diagnosis is made by an evaluation of your symptoms. There are no medical or lab tests that lead to a diagnosis, but there are various questionnaires that a health care provider may use to identify those with the baby blues, postpartum depression, or psychosis. Often, a screening tool called the Edinburgh Postnatal Depression Scale is used to diagnose depression in the postpartum period.   TREATMENT  The baby blues usually goes away on its own in 1-2 weeks. Social support is often all that is needed. You will be encouraged to get adequate sleep and rest. Occasionally, you may be given medicines to help you sleep.   Postpartum depression requires treatment because it can last several months or  longer if it is not treated. Treatment may include individual or group therapy, medicine, or both to address any social, physiological, and psychological factors that may play a role in the depression. Regular exercise, a healthy diet, rest, and social support may also be strongly recommended.   Postpartum psychosis is more serious and needs treatment right away. Hospitalization is often needed.  HOME CARE INSTRUCTIONS  · Get as much rest as you can. Nap when the baby sleeps.    · Exercise regularly. Some women find yoga and walking to be beneficial.    · Eat a balanced and nourishing diet.    · Do little things that you enjoy. Have a cup of tea, take a bubble bath, read your favorite magazine, or listen to your favorite music.  · Avoid alcohol.    · Ask for help with household chores, cooking, grocery shopping, or running errands as needed. Do not try to do everything.    · Talk to people close to you about how you are feeling. Get support from your partner, family members, friends, or other new moms.  · Try to stay positive in how you think. Think about the things you are grateful for.    · Do not spend a lot of time alone.    · Only take over-the-counter or prescription medicine as directed by your health care provider.  · Keep all your postpartum appointments.    · Let your health care provider know if you have any concerns.    SEEK MEDICAL CARE IF:  You are having a reaction to or problems with your medicine.  SEEK IMMEDIATE MEDICAL CARE IF:  · You have suicidal feelings.    · You think you may harm the baby or someone else.  MAKE SURE YOU:  · Understand these instructions.  · Will watch your condition.  · Will get help right away if you are not doing well or get worse.  Document Released: 07/06/2004 Document Revised: 10/07/2013 Document Reviewed: 07/14/2013  ExitCare® Patient Information ©2015 [employer]. This information is not intended to replace advice given to you by your health care provider. Make sure  you discuss any questions you have with your health care provider.

## 2014-08-19 NOTE — Discharge Summary (Signed)
Obstetric Discharge Summary Reason for Admission: rupture of membranes Prenatal Procedures: ultrasound Intrapartum Procedures: spontaneous vaginal delivery Postpartum Procedures: none Complications-Operative and Postpartum: 2nd degree perineal laceration HEMOGLOBIN  Date Value Ref Range Status  08/18/2014 11.1* 12.0 - 15.0 g/dL Final   HCT  Date Value Ref Range Status  08/18/2014 31.8* 36.0 - 46.0 % Final    Physical Exam:  General: alert, cooperative and no distress Lochia: appropriate Uterine Fundus: firm Incision: NA DVT Evaluation: No evidence of DVT seen on physical exam. Calf/Ankle edema is present.  Discharge Diagnoses: Term Pregnancy-delivered  Discharge Information: Date: 08/19/2014 Activity: pelvic rest Diet: routine Medications: PNV and Ibuprofen Condition: improved Instructions: See discharge diabnosis Discharge to: home Follow-up Information    Follow up with Geryl RankinsVARNADO, Carson Meche, MD. Schedule an appointment as soon as possible for a visit in 6 weeks.   Specialty:  Obstetrics and Gynecology   Why:  Postpartum check up   Contact information:   [address] [place] [id number] [phone]       Newborn Data: Live born female  Birth Weight: 8 lb 6.6 oz (3815 g) APGAR: 8, 9  Home with mother.  Geryl RankinsVARNADO, Yaasir Menken 08/19/2014, 9:17 AM

## 2015-07-21 ENCOUNTER — Other Ambulatory Visit: Payer: Self-pay | Admitting: Obstetrics and Gynecology

## 2016-01-12 ENCOUNTER — Other Ambulatory Visit: Payer: Self-pay | Admitting: Family Medicine

## 2016-01-12 DIAGNOSIS — R945 Abnormal results of liver function studies: Secondary | ICD-10-CM

## 2016-01-27 ENCOUNTER — Other Ambulatory Visit: Payer: Self-pay | Admitting: Obstetrics and Gynecology

## 2016-01-27 ENCOUNTER — Other Ambulatory Visit (HOSPITAL_COMMUNITY)
Admission: RE | Admit: 2016-01-27 | Discharge: 2016-01-27 | Disposition: A | Discharge status: Home / Self Care | Payer: BC Managed Care – PPO | Source: Ambulatory Visit | Attending: Obstetrics and Gynecology | Admitting: Obstetrics and Gynecology

## 2016-01-27 DIAGNOSIS — Z01419 Encounter for gynecological examination (general) (routine) without abnormal findings: Secondary | ICD-10-CM | POA: Diagnosis present

## 2016-01-27 DIAGNOSIS — Z1151 Encounter for screening for human papillomavirus (HPV): Secondary | ICD-10-CM | POA: Insufficient documentation

## 2016-01-31 LAB — CYTOLOGY - PAP

## 2017-08-23 LAB — OB RESULTS CONSOLE HEPATITIS B SURFACE ANTIGEN: Hepatitis B Surface Ag: NEGATIVE

## 2017-08-23 LAB — OB RESULTS CONSOLE RUBELLA ANTIBODY, IGM: RUBELLA: IMMUNE

## 2017-08-23 LAB — OB RESULTS CONSOLE HIV ANTIBODY (ROUTINE TESTING): HIV: NONREACTIVE

## 2017-08-23 LAB — OB RESULTS CONSOLE RPR: RPR: NONREACTIVE

## 2017-08-23 LAB — OB RESULTS CONSOLE GC/CHLAMYDIA
Chlamydia: NEGATIVE
Gonorrhea: NEGATIVE

## 2017-08-24 LAB — OB RESULTS CONSOLE ANTIBODY SCREEN: Antibody Screen: NEGATIVE

## 2017-08-24 LAB — OB RESULTS CONSOLE ABO/RH: RH TYPE: POSITIVE

## 2017-10-16 NOTE — L&D Delivery Note (Signed)
Delivery Note At 11:29 PM a viable and healthy female was delivered via Vaginal, Spontaneous (Presentation: ROA  ).  APGAR: 8, 8; weight  pending.   Placenta status: spont, intact.  Cord:  with the following complications: none.  Cord pH: na  Anesthesia:  local Episiotomy:  na Lacerations:  second Suture Repair: 2-0 rapide Est. Blood Loss (mL):  200 Mom to postpartum.  Baby to Couplet care / Skin to Skin.  Francely Craw J 03/18/2018, 12:02 AM

## 2018-02-13 LAB — OB RESULTS CONSOLE GBS: STREP GROUP B AG: NEGATIVE

## 2018-03-17 ENCOUNTER — Other Ambulatory Visit: Payer: Self-pay

## 2018-03-17 ENCOUNTER — Encounter (HOSPITAL_COMMUNITY): Payer: Self-pay | Admitting: *Deleted

## 2018-03-17 ENCOUNTER — Inpatient Hospital Stay (HOSPITAL_COMMUNITY)
Admission: AD | Admit: 2018-03-17 | Discharge: 2018-03-19 | DRG: 807 | Disposition: A | Discharge status: Home / Self Care | Payer: BC Managed Care – PPO | Attending: Obstetrics and Gynecology | Admitting: Obstetrics and Gynecology

## 2018-03-17 DIAGNOSIS — Z3A40 40 weeks gestation of pregnancy: Secondary | ICD-10-CM

## 2018-03-17 DIAGNOSIS — J45909 Unspecified asthma, uncomplicated: Secondary | ICD-10-CM | POA: Diagnosis present

## 2018-03-17 DIAGNOSIS — Z3483 Encounter for supervision of other normal pregnancy, third trimester: Secondary | ICD-10-CM | POA: Diagnosis present

## 2018-03-17 DIAGNOSIS — Z87891 Personal history of nicotine dependence: Secondary | ICD-10-CM | POA: Diagnosis not present

## 2018-03-17 DIAGNOSIS — O9952 Diseases of the respiratory system complicating childbirth: Principal | ICD-10-CM | POA: Diagnosis present

## 2018-03-17 LAB — POCT FERN TEST: POCT FERN TEST: POSITIVE

## 2018-03-17 MED ORDER — OXYTOCIN 10 UNIT/ML IJ SOLN
INTRAMUSCULAR | Status: AC
  Filled 2018-03-17: qty 2

## 2018-03-17 MED ORDER — LACTATED RINGERS IV SOLN: 500.0000 mL | INTRAVENOUS | Status: DC | PRN

## 2018-03-17 MED ORDER — OXYCODONE-ACETAMINOPHEN 5-325 MG PO TABS
2.0000 | ORAL_TABLET | ORAL | Status: DC | PRN
  Administered 2018-03-18: 2 via ORAL
  Filled 2018-03-17: qty 2

## 2018-03-17 MED ORDER — OXYTOCIN BOLUS FROM INFUSION
500.0000 mL | Freq: Once | INTRAVENOUS | Status: AC
  Administered 2018-03-17: 500 mL via INTRAVENOUS

## 2018-03-17 MED ORDER — PHENYLEPHRINE 40 MCG/ML (10ML) SYRINGE FOR IV PUSH (FOR BLOOD PRESSURE SUPPORT)
80.0000 ug | PREFILLED_SYRINGE | INTRAVENOUS | Status: DC | PRN
  Filled 2018-03-17: qty 5

## 2018-03-17 MED ORDER — EPHEDRINE 5 MG/ML INJ
10.0000 mg | INTRAVENOUS | Status: DC | PRN
  Filled 2018-03-17: qty 2

## 2018-03-17 MED ORDER — FLEET ENEMA 7-19 GM/118ML RE ENEM: 1.0000 | ENEMA | RECTAL | Status: DC | PRN

## 2018-03-17 MED ORDER — LACTATED RINGERS IV SOLN: 500.0000 mL | Freq: Once | INTRAVENOUS | Status: DC

## 2018-03-17 MED ORDER — SOD CITRATE-CITRIC ACID 500-334 MG/5ML PO SOLN: 30.0000 mL | ORAL | Status: DC | PRN

## 2018-03-17 MED ORDER — DIPHENHYDRAMINE HCL 50 MG/ML IJ SOLN
12.5000 mg | INTRAMUSCULAR | Status: DC | PRN
Start: 2018-03-17 — End: 2018-03-18

## 2018-03-17 MED ORDER — LIDOCAINE HCL (PF) 1 % IJ SOLN
30.0000 mL | INTRAMUSCULAR | Status: DC | PRN
  Filled 2018-03-17: qty 30

## 2018-03-17 MED ORDER — ACETAMINOPHEN 325 MG PO TABS: 650.0000 mg | ORAL_TABLET | ORAL | Status: DC | PRN

## 2018-03-17 MED ORDER — ONDANSETRON HCL 4 MG/2ML IJ SOLN
4.0000 mg | Freq: Four times a day (QID) | INTRAMUSCULAR | Status: DC | PRN
  Administered 2018-03-18: 4 mg via INTRAVENOUS

## 2018-03-17 MED ORDER — FENTANYL 2.5 MCG/ML BUPIVACAINE 1/10 % EPIDURAL INFUSION (WH - ANES): 14.0000 mL/h | INTRAMUSCULAR | Status: DC | PRN

## 2018-03-17 MED ORDER — TERBUTALINE SULFATE 1 MG/ML IJ SOLN
INTRAMUSCULAR | Status: AC
  Filled 2018-03-17: qty 1

## 2018-03-17 MED ORDER — LACTATED RINGERS IV SOLN: INTRAVENOUS | Status: DC

## 2018-03-17 MED ORDER — OXYTOCIN 40 UNITS IN LACTATED RINGERS INFUSION - SIMPLE MED
2.5000 [IU]/h | INTRAVENOUS | Status: DC
  Filled 2018-03-17: qty 1000

## 2018-03-17 MED ORDER — OXYCODONE-ACETAMINOPHEN 5-325 MG PO TABS: 1.0000 | ORAL_TABLET | ORAL | Status: DC | PRN

## 2018-03-17 NOTE — H&P (Signed)
Christina Griffin is a 37 y.o. G1P1001 at 40'1 presenting for active labor. Pt notes intermittent contractions. Good fetal movement, No vaginal bleeding, not leaking fluid.  [facility] 8 wks - AMA, nl NT - HSV, on Valtrex suppression   Prenatal Transfer Tool  Maternal Diabetes: No Genetic Screening: Normal Maternal Ultrasounds/Referrals: Normal Fetal Ultrasounds or other Referrals:  None Maternal Substance Abuse:  No Significant Maternal Medications:  None Significant Maternal Lab Results: None     OB History    Gravida  1   Para  1   Term  1   Preterm      AB      Living  1     SAB      TAB      Ectopic      Multiple  0   Live Births  1          Past Medical History:  Diagnosis Date  . Asthma    prn inhaler - no use in 2 mos.  . Chronic cholecystitis 06/2013  . Chronic SI joint pain   . Dental crowns present   . Eczema    lips  . Gallbladder polyp 06/2013  . Genital herpes   . MVP (mitral valve prolapse)   . Seasonal allergies    Past Surgical History:  Procedure Laterality Date  . CHOLECYSTECTOMY N/A 07/18/2013   Procedure: LAPAROSCOPIC CHOLECYSTECTOMY WITH INTRAOPERATIVE CHOLANGIOGRAM;  Surgeon: Ernestene MentionHaywood M Ingram, MD;  Location: [facility];  Service: General;  Laterality: N/A;  . WISDOM TOOTH EXTRACTION     as a teenager   Family History: family history includes Asthma in her mother; Breast cancer in her paternal aunt; Congenital heart disease in her father; Heart disease in her maternal grandmother. Social History:  reports that she quit smoking about 17 years ago. She has never used smokeless tobacco. She reports that she does not drink alcohol or use drugs.  Review of Systems - Negative except discomfort or pregnancy     unknown if currently breastfeeding.  Physical Exam:  Gen: well appearing, no distress Back: no CVAT Abd: gravid, NT, no RUQ pain LE: trace edema, equal bilaterally,  non-tender   Prenatal labs: ABO, Rh:  A+ Antibody:  neg Rubella:  immune RPR:   NR HBsAg:   neg HIV:   neg GBS:   neg 1 hr Glucola 126  Genetic screening nl NT, nl AFP Anatomy US normal   Assessment/Plan: 37 y.o. G1P1001 at 40'1 Acive labor - GBS neg     Please note H&P was written prior to planned IOL, Pt came in and delivered prior to IOL. H&P saved to give further info about pt's PN course.  Lendon ColonelKelly A Isaah Furry 03/18/2018 5:07 PM

## 2018-03-18 ENCOUNTER — Encounter (HOSPITAL_COMMUNITY): Payer: Self-pay

## 2018-03-18 ENCOUNTER — Other Ambulatory Visit: Payer: Self-pay

## 2018-03-18 ENCOUNTER — Inpatient Hospital Stay (HOSPITAL_COMMUNITY)
Admission: RE | Admit: 2018-03-18 | Discharge: 2018-03-18 | Disposition: A | Discharge status: Home / Self Care | Payer: BC Managed Care – PPO | Source: Ambulatory Visit | Attending: Obstetrics | Admitting: Obstetrics

## 2018-03-18 LAB — CBC
HCT: 37.5 % (ref 36.0–46.0)
HEMATOCRIT: 35.8 % — AB (ref 36.0–46.0)
Hemoglobin: 12 g/dL (ref 12.0–15.0)
Hemoglobin: 12.5 g/dL (ref 12.0–15.0)
MCH: 29.7 pg (ref 26.0–34.0)
MCH: 30 pg (ref 26.0–34.0)
MCHC: 33.5 g/dL (ref 30.0–36.0)
MCHC: 33.3 g/dL (ref 30.0–36.0)
MCV: 88.6 fL (ref 78.0–100.0)
MCV: 89.9 fL (ref 78.0–100.0)
PLATELETS: 188 10*3/uL (ref 150–400)
Platelets: 189 10*3/uL (ref 150–400)
RBC: 4.04 MIL/uL (ref 3.87–5.11)
RBC: 4.17 MIL/uL (ref 3.87–5.11)
RDW: 14.3 % (ref 11.5–15.5)
RDW: 14.4 % (ref 11.5–15.5)
WBC: 17.6 10*3/uL — ABNORMAL HIGH (ref 4.0–10.5)
WBC: 18 10*3/uL — AB (ref 4.0–10.5)

## 2018-03-18 LAB — TYPE AND SCREEN
ABO/RH(D): A POS
Antibody Screen: NEGATIVE

## 2018-03-18 LAB — RPR: RPR Ser Ql: NONREACTIVE

## 2018-03-18 MED ORDER — WITCH HAZEL-GLYCERIN EX PADS: 1.0000 "application " | MEDICATED_PAD | CUTANEOUS | Status: DC | PRN

## 2018-03-18 MED ORDER — DIPHENHYDRAMINE HCL 25 MG PO CAPS: 25.0000 mg | ORAL_CAPSULE | Freq: Four times a day (QID) | ORAL | Status: DC | PRN

## 2018-03-18 MED ORDER — IBUPROFEN 600 MG PO TABS
600.0000 mg | ORAL_TABLET | Freq: Four times a day (QID) | ORAL | Status: DC
  Administered 2018-03-18 (×2): 600 mg via ORAL
  Filled 2018-03-18 (×2): qty 1

## 2018-03-18 MED ORDER — SIMETHICONE 80 MG PO CHEW: 80.0000 mg | CHEWABLE_TABLET | ORAL | Status: DC | PRN

## 2018-03-18 MED ORDER — BENZOCAINE-MENTHOL 20-0.5 % EX AERO
1.0000 "application " | INHALATION_SPRAY | CUTANEOUS | Status: DC | PRN
  Administered 2018-03-18: 1 via TOPICAL
  Filled 2018-03-18: qty 56

## 2018-03-18 MED ORDER — METHYLERGONOVINE MALEATE 0.2 MG PO TABS: 0.2000 mg | ORAL_TABLET | ORAL | Status: DC | PRN

## 2018-03-18 MED ORDER — OXYCODONE-ACETAMINOPHEN 5-325 MG PO TABS: 2.0000 | ORAL_TABLET | ORAL | Status: DC | PRN

## 2018-03-18 MED ORDER — ONDANSETRON HCL 4 MG PO TABS: 4.0000 mg | ORAL_TABLET | ORAL | Status: DC | PRN

## 2018-03-18 MED ORDER — OXYCODONE-ACETAMINOPHEN 5-325 MG PO TABS: 1.0000 | ORAL_TABLET | ORAL | Status: DC | PRN

## 2018-03-18 MED ORDER — ACETAMINOPHEN 325 MG PO TABS
650.0000 mg | ORAL_TABLET | ORAL | Status: DC | PRN
  Administered 2018-03-19: 650 mg via ORAL
  Filled 2018-03-18: qty 2

## 2018-03-18 MED ORDER — PRENATAL MULTIVITAMIN CH
1.0000 | ORAL_TABLET | Freq: Every day | ORAL | Status: DC
  Administered 2018-03-18: 1 via ORAL
  Filled 2018-03-18: qty 1

## 2018-03-18 MED ORDER — ZOLPIDEM TARTRATE 5 MG PO TABS: 5.0000 mg | ORAL_TABLET | Freq: Every evening | ORAL | Status: DC | PRN

## 2018-03-18 MED ORDER — ONDANSETRON HCL 4 MG/2ML IJ SOLN: 4.0000 mg | INTRAMUSCULAR | Status: DC | PRN

## 2018-03-18 MED ORDER — TETANUS-DIPHTH-ACELL PERTUSSIS 5-2.5-18.5 LF-MCG/0.5 IM SUSP: 0.5000 mL | Freq: Once | INTRAMUSCULAR | Status: DC

## 2018-03-18 MED ORDER — SENNOSIDES-DOCUSATE SODIUM 8.6-50 MG PO TABS
2.0000 | ORAL_TABLET | ORAL | Status: DC
  Administered 2018-03-19: 2 via ORAL
  Filled 2018-03-18: qty 2

## 2018-03-18 MED ORDER — METHYLERGONOVINE MALEATE 0.2 MG/ML IJ SOLN
0.2000 mg | Freq: Once | INTRAMUSCULAR | Status: AC
  Administered 2018-03-18: 0.2 mg via INTRAMUSCULAR

## 2018-03-18 MED ORDER — DIBUCAINE 1 % RE OINT: 1.0000 "application " | TOPICAL_OINTMENT | RECTAL | Status: DC | PRN

## 2018-03-18 MED ORDER — METHYLERGONOVINE MALEATE 0.2 MG/ML IJ SOLN
0.2000 mg | INTRAMUSCULAR | Status: DC | PRN
Start: 2018-03-18 — End: 2018-03-18

## 2018-03-18 MED ORDER — IBUPROFEN 800 MG PO TABS
800.0000 mg | ORAL_TABLET | Freq: Four times a day (QID) | ORAL | Status: DC
  Administered 2018-03-18 – 2018-03-19 (×3): 800 mg via ORAL
  Filled 2018-03-18 (×3): qty 1

## 2018-03-18 MED ORDER — COCONUT OIL OIL: 1.0000 "application " | TOPICAL_OIL | Status: DC | PRN

## 2018-03-18 MED ORDER — METHYLERGONOVINE MALEATE 0.2 MG/ML IJ SOLN
INTRAMUSCULAR | Status: AC
  Administered 2018-03-18: 0.2 mg via INTRAMUSCULAR
  Filled 2018-03-18: qty 1

## 2018-03-18 NOTE — Lactation Note (Signed)
This note was copied from a baby's chart. Lactation Consultation Note  Patient Name: Boy Marayah Jenean LindauBruner de Rodriguez UJWJX'BToday's Date: 03/18/2018 Reason for consult: Initial assessment;Term  P2 mother whose infant is now 575 hours old and in the NICU being monitored.  He had meconium stained fluid and decreased oxygen saturations.  Initiated DEBP for mother to begin pumping since baby is in NICU. #24 flange size is appropriate for now but may move to a #27 by tomorrow.  Discussed pump parts, assembly and cleaning.  Encouraged hand expression after pumping and colostrum container provided for any EBM mother may obtain with hand expression.  If baby transfers back to mother's room I encouraged feeding 8-12 times/24 hours or earlier if he shows feeding cues.  Reviewed STS and hand expression.  Mother would like help with latch when he is ready to feed.  I reassured her that any RN/LC would be able to assist when the baby has returned from NICU.  Mother has no further questions/concerns at this time.  Mom made aware of O/P services, breastfeeding support groups, community resources, and our phone # for post-discharge questions.  Father present and sleeping.   Maternal Data Formula Feeding for Exclusion: No Has patient been taught Hand Expression?: Yes Does the patient have breastfeeding experience prior to this delivery?: Yes  Feeding Feeding Type: Breast Fed Length of feed: 20 min  LATCH Score Latch: Repeated attempts needed to sustain latch, nipple held in mouth throughout feeding, stimulation needed to elicit sucking reflex.  Audible Swallowing: None  Type of Nipple: Everted at rest and after stimulation  Comfort (Breast/Nipple): Soft / non-tender  Hold (Positioning): Assistance needed to correctly position infant at breast and maintain latch.  LATCH Score: 6  Interventions    Lactation Tools Discussed/Used WIC Program: No Pump Review: Setup, frequency, and cleaning;Milk  Storage Initiated by:: Kyce Ging Date initiated:: 03/18/18   Consult Status Consult Status: Follow-up Date: 03/19/18 Follow-up type: In-patient    Lovell Roe R Riccardo Holeman 03/18/2018, 5:11 AM

## 2018-03-18 NOTE — Progress Notes (Signed)
PPD 1 SVD with 2nd degree repair  S:  Reports feeling ok - sore and crampy             Tolerating po/ No nausea or vomiting             Bleeding is moderate             Pain minimally controlled with motrin             Up ad lib / ambulatory / voiding QS  Newborn admit to NICU - plans Breast   O:  VS: BP 124/60 (BP Location: Left Arm)   Pulse 84   Temp 97.9 F (36.6 C) (Oral)   Resp 18   SpO2 99%   Breastfeeding? Unknown    LABS:              Recent Labs    03/18/18 0014 03/18/18 0554  WBC 17.6* 18.0*  HGB 12.5 12.0  PLT 188 189               Blood type: --/--/A POS (06/03 0014)  Rubella: Immune (11/08 0000)                     I&O: Intake/Output      06/02 0701 - 06/03 0700 06/03 0701 - 06/04 0700   Urine 200    Blood 709    Total Output 909    Net -909                    Physical Exam:             Alert and oriented X3  Abdomen: soft, non-tender, non-distended              Fundus: firm, non-tender, Ueven  Perineum: ice pack in place  Lochia: moderate  Extremities: trace edema, no calf pain or tenderness    A: PPD # 1 with 2nd degree repair   Doing well - stable status  P: Routine post partum orders  Increase motrin dose - anticipatory care             DC tomorrow  Marlinda Mikeanya Lakrista Scaduto CNM, MSN, [facility] 03/18/2018, 10:24 AM

## 2018-03-18 NOTE — H&P (Signed)
Christina Griffin is a 37 y.o. female presenting for labor. OB History    Gravida  2   Para  1   Term  1   Preterm      AB      Living  1     SAB      TAB      Ectopic      Multiple  0   Live Births  1          Past Medical History:  Diagnosis Date  . Asthma    prn inhaler - no use in 2 mos.  . Chronic cholecystitis 06/2013  . Chronic SI joint pain   . Dental crowns present   . Eczema    lips  . Gallbladder polyp 06/2013  . Genital herpes   . MVP (mitral valve prolapse)   . Seasonal allergies    Past Surgical History:  Procedure Laterality Date  . CHOLECYSTECTOMY N/A 07/18/2013   Procedure: LAPAROSCOPIC CHOLECYSTECTOMY WITH INTRAOPERATIVE CHOLANGIOGRAM;  Surgeon: Ernestene MentionHaywood M Ingram, MD;  Location: [facility];  Service: General;  Laterality: N/A;  . WISDOM TOOTH EXTRACTION     as a teenager   Family History: family history includes Asthma in her mother; Breast cancer in her paternal aunt; Congenital heart disease in her father; Heart disease in her maternal grandmother. Social History:  reports that she quit smoking about 17 years ago. She has never used smokeless tobacco. She reports that she does not drink alcohol or use drugs.     Maternal Diabetes: No Genetic Screening: Normal Maternal Ultrasounds/Referrals: Normal Fetal Ultrasounds or other Referrals:  None Maternal Substance Abuse:  No Significant Maternal Medications:  None Significant Maternal Lab Results:  None Other Comments:  None  Review of Systems  Constitutional: Negative.   All other systems reviewed and are negative.  Maternal Medical History:  Reason for admission: Contractions.   Contractions: Onset was less than 1 hour ago.   Perceived severity is strong.    Fetal activity: Perceived fetal activity is normal.   Last perceived fetal movement was within the past hour.    Prenatal complications: no prenatal complications   Dilation: 10 Effacement (%):  90 Station: Plus 3 Exam by:: C Fisher RN Blood pressure (!) 141/77, pulse 80, resp. rate 18, unknown if currently breastfeeding. Maternal Exam:  Uterine Assessment: Contraction strength is moderate.  Contraction frequency is regular.   Abdomen: Patient reports no abdominal tenderness. Fetal presentation: vertex  Introitus: Normal vulva. Normal vagina.  Pelvis: adequate for delivery.   Cervix: Cervix evaluated by digital exam.     Physical Exam  Nursing note and vitals reviewed. Constitutional: She appears well-developed and well-nourished.  Cardiovascular: Normal rate and regular rhythm.  Respiratory: Effort normal and breath sounds normal.  GI: Soft. Bowel sounds are normal.  Genitourinary: Vagina normal and uterus normal.    Prenatal labs: ABO, Rh: A/Positive/-- (11/09 0000) Antibody: Negative (11/09 0000) Rubella: Immune (11/08 0000) RPR: Nonreactive (11/08 0000)  HBsAg: Negative (11/08 0000)  HIV: Non-reactive (11/08 0000)  GBS: Negative (05/01 0000)   Assessment/Plan: TErm IUP Active Labor Admit   Christina Griffin J 03/18/2018, 12:00 AM

## 2018-03-19 MED ORDER — IBUPROFEN 800 MG PO TABS: 800.0000 mg | ORAL_TABLET | Freq: Four times a day (QID) | ORAL | 0 refills | Status: AC

## 2018-03-19 NOTE — Discharge Summary (Signed)
OB Discharge Summary  Patient Name: Christina Griffin DOB: 05/01/1981 MRN: [id number]  Date of admission: 03/17/2018 Delivering MD: Olivia MackieAAVON, RICHARD   Date of discharge: 03/19/2018  Admitting diagnosis: 40 WKS, LABOR Intrauterine pregnancy: 4045w0d     Secondary diagnosis:Principal Problem:   Postpartum care following vaginal delivery (6/2) Active Problems:   Indication for care in labor or delivery  Additional problems:none     Discharge diagnosis: Term Pregnancy Delivered                                                                     Post partum procedures:none  Augmentation: none  Complications: None  Hospital course:  Onset of Labor With Vaginal Delivery     37 y.o. yo G2P2002 at 4945w0d was admitted in Active Labor on 03/17/2018. Patient had an uncomplicated labor course as follows:  Membrane Rupture Time/Date: 9:30 PM ,03/17/2018   Intrapartum Procedures: Episiotomy: None [1]                                         Lacerations:  2nd degree [3];Perineal [11]  Patient had a delivery of a Viable infant. 03/17/2018  Information for the patient's newborn:  Lawrence SantiagoBruner de Griffin, Boy Yovanna [[id number]][[id number]]  Delivery Method: Vaginal, Spontaneous(Filed from Delivery Summary)    Pateint had an uncomplicated postpartum course.  She is ambulating, tolerating a regular diet, passing flatus, and urinating well. Patient is discharged home in stable condition on 03/19/18.   Physical exam  Vitals:   03/18/18 0931 03/18/18 1942 03/18/18 2030 03/19/18 0537  BP: 124/60 114/62 109/62 101/60  Pulse: 84 63 64 70  Resp:  16 16 16   Temp: 97.9 F (36.6 C) 98.4 F (36.9 C) 98 F (36.7 C) 97.6 F (36.4 C)  TempSrc: Oral Oral Oral Oral  SpO2:  97% 98% 98%   General: alert, cooperative and no distress Lochia: appropriate Uterine Fundus: firm Incision: N/A DVT Evaluation: No evidence of DVT seen on physical exam. Labs: Lab Results  Component Value Date   WBC 18.0 (H) 03/18/2018   HGB 12.0 03/18/2018   HCT 35.8 (L) 03/18/2018   MCV 88.6 03/18/2018   PLT 189 03/18/2018   CMP Latest Ref Rng & Units 07/16/2013  Glucose 70 - 99 mg/dL 82  BUN 6 - 23 mg/dL 11  Creatinine 2.130.50 - 0.861.10 mg/dL 5.780.61  Sodium 469135 - 629145 mEq/L 139  Potassium 3.5 - 5.1 mEq/L 3.9  Chloride 96 - 112 mEq/L 105  CO2 19 - 32 mEq/L 23  Calcium 8.4 - 10.5 mg/dL 9.2  Total Protein 6.0 - 8.3 g/dL 6.9  Total Bilirubin 0.3 - 1.2 mg/dL 0.4  Alkaline Phos 39 - 117 U/L 43  AST 0 - 37 U/L 14  ALT 0 - 35 U/L 9    Discharge instruction: per After Visit Summary and "Baby and Me Booklet".  After Visit Meds:    Diet: routine diet  Activity: Advance as tolerated. Pelvic rest for 6 weeks.   Outpatient follow up:6 weeks Follow up Appt:No future appointments. Follow up visit: No follow-ups on file.  Postpartum contraception: IUD Mirena  Newborn Data: Live born female  Birth Weight: 8 lb 4.3 oz (3750 g) APGAR: 8, 8  Newborn Delivery   Birth date/time:  03/17/2018 23:29:00 Delivery type:  Vaginal, Spontaneous     Baby Feeding: Breast Disposition:home with mother   03/19/2018 Lendon Colonel, MD

## 2018-03-19 NOTE — Lactation Note (Signed)
This note was copied from a baby's chart. Lactation Consultation Note  Patient Name: Boy Tyrina Jenean LindauBruner de Rodriguez NGEXB'MToday's Date: 03/19/2018 Reason for consult: Follow-up assessment   P2, Baby 34 hours old. Answered questions regarding latch and depth. Mom encouraged to feed baby 8-12 times/24 hours and with feeding cues.  Reviewed engorgement care and monitoring voids/stools.    Maternal Data    Feeding Feeding Type: Breast Fed Length of feed: 15 min  LATCH Score Latch: Grasps breast easily, tongue down, lips flanged, rhythmical sucking.  Audible Swallowing: A few with stimulation  Type of Nipple: Everted at rest and after stimulation  Comfort (Breast/Nipple): Filling, red/small blisters or bruises, mild/mod discomfort  Hold (Positioning): No assistance needed to correctly position infant at breast.  LATCH Score: 8  Interventions    Lactation Tools Discussed/Used     Consult Status Consult Status: Complete    Hardie PulleyBerkelhammer, Ruth Boschen 03/19/2018, 9:55 AM

## 2020-09-06 ENCOUNTER — Other Ambulatory Visit: Payer: Self-pay | Admitting: Family Medicine

## 2020-09-06 DIAGNOSIS — Z1231 Encounter for screening mammogram for malignant neoplasm of breast: Secondary | ICD-10-CM

## 2020-09-07 ENCOUNTER — Other Ambulatory Visit: Payer: Self-pay

## 2020-09-07 ENCOUNTER — Ambulatory Visit
Admission: RE | Admit: 2020-09-07 | Discharge: 2020-09-07 | Disposition: A | Discharge status: Home / Self Care | Payer: BC Managed Care – PPO | Source: Ambulatory Visit | Attending: Family Medicine | Admitting: Family Medicine

## 2020-09-07 DIAGNOSIS — Z1231 Encounter for screening mammogram for malignant neoplasm of breast: Secondary | ICD-10-CM

## 2020-09-15 ENCOUNTER — Encounter: Payer: Self-pay | Admitting: Pulmonary Disease

## 2020-09-15 ENCOUNTER — Ambulatory Visit: Payer: BC Managed Care – PPO | Admitting: Pulmonary Disease

## 2020-09-15 ENCOUNTER — Other Ambulatory Visit: Payer: Self-pay

## 2020-09-15 VITALS — BP 124/72 | HR 66 | Temp 98.2°F | Ht 70.0 in | Wt 236.6 lb

## 2020-09-15 DIAGNOSIS — J4530 Mild persistent asthma, uncomplicated: Secondary | ICD-10-CM | POA: Diagnosis not present

## 2020-09-15 DIAGNOSIS — R0602 Shortness of breath: Secondary | ICD-10-CM

## 2020-09-15 MED ORDER — BREO ELLIPTA 100-25 MCG/INH IN AEPB: 1.0000 | INHALATION_SPRAY | Freq: Every day | RESPIRATORY_TRACT | 2 refills | Status: AC

## 2020-09-15 MED ORDER — ALBUTEROL SULFATE HFA 108 (90 BASE) MCG/ACT IN AERS: 2.0000 | INHALATION_SPRAY | Freq: Four times a day (QID) | RESPIRATORY_TRACT | 3 refills | Status: AC | PRN

## 2020-09-15 NOTE — Patient Instructions (Signed)
We will send you in a prescription for Breo -Use once daily -Ensure you rinse your mouth after use  Prescription for albuterol sent in-to be used 2 puffs up to 4 times a day as needed  Call with significant concerns  Follow-up in 3 months

## 2020-09-15 NOTE — Progress Notes (Signed)
Christina Griffin    [id number]    1981/03/08  Primary Care Physician:Barnes, Lanora Manis, MD  Referring Physician: Juluis Rainier, MD [address] [place],  [place] [address]  Chief complaint:   Patient with a history of asthma Worsening symptoms recently  HPI:  History of asthma Uses albuterol as needed Has been using albuterol daily  Diagnosed with asthma over 30 years ago Has never really had severe asthma Asthma gets exacerbated when allergies are acting up, for allergies, she uses Zyrtec on a daily basis  Denies regular wheezing Occasional cough Has more symptoms with activity especially when she is trying to run  There is family history of asthma  Never smoker  No occupational exposure  Outpatient Encounter Medications as of 09/15/2020  Medication Sig  . albuterol (VENTOLIN HFA) 108 (90 Base) MCG/ACT inhaler Inhale 2 puffs into the lungs every 6 (six) hours as needed for wheezing.  . cetirizine (ZYRTEC) 10 MG tablet Take 10 mg by mouth daily.  Marland Kitchen ibuprofen (ADVIL,MOTRIN) 800 MG tablet Take 1 tablet (800 mg total) by mouth every 6 (six) hours.  . Prenatal Vit-Fe Fumarate-FA (PRENATAL MULTIVITAMIN) TABS tablet Take 1 tablet by mouth daily at 12 noon.  . [DISCONTINUED] albuterol (PROVENTIL HFA;VENTOLIN HFA) 108 (90 BASE) MCG/ACT inhaler Inhale 2 puffs into the lungs every 6 (six) hours as needed for wheezing.  Marland Kitchen desonide (DESONATE) 0.05 % gel Apply topically 2 (two) times daily. (Patient not taking: Reported on 09/15/2020)  . fluticasone furoate-vilanterol (BREO ELLIPTA) 100-25 MCG/INH AEPB Inhale 1 puff into the lungs daily.   No facility-administered encounter medications on file as of 09/15/2020.    Allergies as of 09/15/2020 - Review Complete 09/15/2020  Allergen Reaction Noted  . Thimerosal  03/18/2018    Past Medical History:  Diagnosis Date  . Asthma    prn inhaler - no use in 2 mos.  . Chronic cholecystitis 06/2013  . Chronic SI  joint pain   . Dental crowns present   . Eczema    lips  . Gallbladder polyp 06/2013  . Genital herpes   . MVP (mitral valve prolapse)   . Seasonal allergies     Past Surgical History:  Procedure Laterality Date  . CHOLECYSTECTOMY N/A 07/18/2013   Procedure: LAPAROSCOPIC CHOLECYSTECTOMY WITH INTRAOPERATIVE CHOLANGIOGRAM;  Surgeon: [name], MD;  Location: [facility];  Service: General;  Laterality: N/A;  . WISDOM TOOTH EXTRACTION     as a teenager    Family History  Problem Relation Age of Onset  . Asthma Mother   . Congenital heart disease Father        pig valve (2007-2008)  . Breast cancer Paternal Aunt        breast  . Heart disease Maternal Grandmother     Social History   Socioeconomic History  . Marital status: Married    Spouse name: Not on file  . Number of children: Not on file  . Years of education: Not on file  . Highest education level: Not on file  Occupational History  . Not on file  Tobacco Use  . Smoking status: Former Smoker    Quit date: 10/15/2000    Years since quitting: 19.9  . Smokeless tobacco: Never Used  Substance and Sexual Activity  . Alcohol use: No  . Drug use: No  . Sexual activity: Yes  Other Topics Concern  . Not on file  Social History Narrative  .  Not on file   Social Determinants of Health   Financial Resource Strain:   . Difficulty of Paying Living Expenses: Not on file  Food Insecurity:   . Worried About Programme researcher, broadcasting/film/video in the Last Year: Not on file  . Ran Out of Food in the Last Year: Not on file  Transportation Needs:   . Lack of Transportation (Medical): Not on file  . Lack of Transportation (Non-Medical): Not on file  Physical Activity:   . Days of Exercise per Week: Not on file  . Minutes of Exercise per Session: Not on file  Stress:   . Feeling of Stress : Not on file  Social Connections:   . Frequency of Communication with Friends and Family: Not on file  . Frequency of Social  Gatherings with Friends and Family: Not on file  . Attends Religious Services: Not on file  . Active Member of Clubs or Organizations: Not on file  . Attends Banker Meetings: Not on file  . Marital Status: Not on file  Intimate Partner Violence:   . Fear of Current or Ex-Partner: Not on file  . Emotionally Abused: Not on file  . Physically Abused: Not on file  . Sexually Abused: Not on file    Review of Systems  Respiratory: Positive for shortness of breath and wheezing.     Vitals:   09/15/20 1336  BP: 124/72  Pulse: 66  Temp: 98.2 F (36.8 C)  SpO2: 100%     Physical Exam Constitutional:      Appearance: She is obese.  HENT:     Nose: No congestion or rhinorrhea.     Mouth/Throat:     Pharynx: No oropharyngeal exudate or posterior oropharyngeal erythema.  Eyes:     General:        Right eye: No discharge.        Left eye: No discharge.  Cardiovascular:     Rate and Rhythm: Normal rate and regular rhythm.     Heart sounds: No murmur heard.  No friction rub.  Pulmonary:     Effort: Pulmonary effort is normal. No respiratory distress.     Breath sounds: Normal breath sounds. No stridor. No wheezing or rhonchi.  Musculoskeletal:     Cervical back: No rigidity or tenderness.  Neurological:     Mental Status: She is alert.  Psychiatric:        Mood and Affect: Mood normal.     Data Reviewed: No recent chest x-ray  Assessment:  Asthma with exacerbation Mild persistent asthma at least with significant exacerbation with activity  Shortness of breath on exertion  Plan/Recommendations: Prescription for Breo to be used once a day Albuterol to be used as needed  Follow-up in 3 months  We will consider PFT if symptoms are not better controlled  Encouraged to call with any significant concerns   Virl Diamond MD [facility] 09/15/2020, 1:51 PM  CC: Juluis Rainier, MD

## 2020-10-04 ENCOUNTER — Telehealth: Payer: Self-pay | Admitting: Pulmonary Disease

## 2020-10-04 MED ORDER — BREO ELLIPTA 100-25 MCG/INH IN AEPB: 1.0000 | INHALATION_SPRAY | Freq: Every day | RESPIRATORY_TRACT | 6 refills | Status: AC

## 2020-10-04 NOTE — Telephone Encounter (Signed)
Called and spoke with patient about RX for Breo. Looks like RX was not sent to pharmacy. Advised patient that I would send that in for her now per her AVS. She verified pharmacy. Nothing further needed at this time.

## 2022-03-07 ENCOUNTER — Other Ambulatory Visit: Payer: Self-pay | Admitting: Family Medicine

## 2022-03-07 DIAGNOSIS — E049 Nontoxic goiter, unspecified: Secondary | ICD-10-CM

## 2022-03-23 ENCOUNTER — Ambulatory Visit
Admission: RE | Admit: 2022-03-23 | Discharge: 2022-03-23 | Disposition: A | Discharge status: Home / Self Care | Payer: BC Managed Care – PPO | Source: Ambulatory Visit | Attending: Family Medicine | Admitting: Family Medicine

## 2022-03-23 ENCOUNTER — Other Ambulatory Visit: Payer: Self-pay | Admitting: Family Medicine

## 2022-03-23 DIAGNOSIS — M79671 Pain in right foot: Secondary | ICD-10-CM

## 2022-03-23 DIAGNOSIS — E049 Nontoxic goiter, unspecified: Secondary | ICD-10-CM

## 2022-03-31 ENCOUNTER — Other Ambulatory Visit: Payer: Self-pay | Admitting: Family Medicine

## 2022-04-03 ENCOUNTER — Other Ambulatory Visit: Payer: Self-pay | Admitting: Family Medicine

## 2022-04-03 DIAGNOSIS — E042 Nontoxic multinodular goiter: Secondary | ICD-10-CM

## 2022-04-06 ENCOUNTER — Other Ambulatory Visit (HOSPITAL_COMMUNITY)
Admission: RE | Admit: 2022-04-06 | Discharge: 2022-04-06 | Disposition: A | Discharge status: Home / Self Care | Payer: BC Managed Care – PPO | Source: Ambulatory Visit | Attending: Radiology | Admitting: Radiology

## 2022-04-06 ENCOUNTER — Ambulatory Visit
Admission: RE | Admit: 2022-04-06 | Discharge: 2022-04-06 | Disposition: A | Discharge status: Home / Self Care | Payer: BC Managed Care – PPO | Source: Ambulatory Visit | Attending: Family Medicine | Admitting: Family Medicine

## 2022-04-06 DIAGNOSIS — E042 Nontoxic multinodular goiter: Secondary | ICD-10-CM | POA: Diagnosis present

## 2022-04-07 LAB — CYTOLOGY - NON PAP

## 2022-05-03 ENCOUNTER — Other Ambulatory Visit: Payer: Self-pay | Admitting: Family Medicine

## 2022-05-03 DIAGNOSIS — Z1231 Encounter for screening mammogram for malignant neoplasm of breast: Secondary | ICD-10-CM

## 2022-05-04 ENCOUNTER — Ambulatory Visit
Admission: RE | Admit: 2022-05-04 | Discharge: 2022-05-04 | Disposition: A | Discharge status: Home / Self Care | Payer: BC Managed Care – PPO | Source: Ambulatory Visit | Attending: Family Medicine | Admitting: Family Medicine

## 2022-05-04 DIAGNOSIS — Z1231 Encounter for screening mammogram for malignant neoplasm of breast: Secondary | ICD-10-CM

## 2022-05-09 ENCOUNTER — Other Ambulatory Visit: Payer: Self-pay | Admitting: Family Medicine

## 2022-05-09 DIAGNOSIS — R928 Other abnormal and inconclusive findings on diagnostic imaging of breast: Secondary | ICD-10-CM

## 2022-05-22 ENCOUNTER — Ambulatory Visit: Payer: BC Managed Care – PPO | Admitting: Family Medicine

## 2022-05-22 ENCOUNTER — Ambulatory Visit
Admission: RE | Admit: 2022-05-22 | Discharge: 2022-05-22 | Disposition: A | Discharge status: Home / Self Care | Payer: BC Managed Care – PPO | Source: Ambulatory Visit | Attending: Family Medicine | Admitting: Family Medicine

## 2022-05-22 ENCOUNTER — Ambulatory Visit: Payer: BC Managed Care – PPO

## 2022-05-22 VITALS — BP 130/88 | Ht 71.0 in | Wt 240.0 lb

## 2022-05-22 DIAGNOSIS — Q6671 Congenital pes cavus, right foot: Secondary | ICD-10-CM

## 2022-05-22 DIAGNOSIS — R269 Unspecified abnormalities of gait and mobility: Secondary | ICD-10-CM | POA: Diagnosis not present

## 2022-05-22 DIAGNOSIS — M722 Plantar fascial fibromatosis: Secondary | ICD-10-CM

## 2022-05-22 DIAGNOSIS — Q6672 Congenital pes cavus, left foot: Secondary | ICD-10-CM | POA: Diagnosis not present

## 2022-05-22 DIAGNOSIS — R928 Other abnormal and inconclusive findings on diagnostic imaging of breast: Secondary | ICD-10-CM

## 2022-05-23 ENCOUNTER — Encounter: Payer: Self-pay | Admitting: Family Medicine

## 2022-05-23 NOTE — Progress Notes (Signed)
PCP: Moshe Cipro, NP  Subjective:   HPI: Patient is a 41 y.o. female here for custom orthotics.  Patient presents as a referral from Dr. Lyn Hollingshead for custom orthotics. She's had plantar fasciitis on left for several months. Tried home exercises, stretches, massage, dry needling, sports insoles, PT.  Past Medical History:  Diagnosis Date   Asthma    prn inhaler - no use in 2 mos.   Chronic cholecystitis 06/2013   Chronic SI joint pain    Dental crowns present    Eczema    lips   Gallbladder polyp 06/2013   Genital herpes    MVP (mitral valve prolapse)    Seasonal allergies     Current Outpatient Medications on File Prior to Visit  Medication Sig Dispense Refill   albuterol (VENTOLIN HFA) 108 (90 Base) MCG/ACT inhaler Inhale 2 puffs into the lungs every 6 (six) hours as needed for wheezing. 18 g 3   cetirizine (ZYRTEC) 10 MG tablet Take 10 mg by mouth daily.     desonide (DESONATE) 0.05 % gel Apply topically 2 (two) times daily. (Patient not taking: Reported on 09/15/2020)     fluticasone furoate-vilanterol (BREO ELLIPTA) 100-25 MCG/INH AEPB Inhale 1 puff into the lungs daily. 60 each 2   fluticasone furoate-vilanterol (BREO ELLIPTA) 100-25 MCG/INH AEPB Inhale 1 puff into the lungs daily. 60 each 6   ibuprofen (ADVIL,MOTRIN) 800 MG tablet Take 1 tablet (800 mg total) by mouth every 6 (six) hours. 30 tablet 0   Prenatal Vit-Fe Fumarate-FA (PRENATAL MULTIVITAMIN) TABS tablet Take 1 tablet by mouth daily at 12 noon.     No current facility-administered medications on file prior to visit.    Past Surgical History:  Procedure Laterality Date   CHOLECYSTECTOMY N/A 07/18/2013   Procedure: LAPAROSCOPIC CHOLECYSTECTOMY WITH INTRAOPERATIVE CHOLANGIOGRAM;  Surgeon: [name], MD;  Location: [facility];  Service: General;  Laterality: N/A;   WISDOM TOOTH EXTRACTION     as a teenager    Allergies  Allergen Reactions   Thimerosal     Unknown reaction     BP 130/88   Ht 5\' 11"  (1.803 m)   Wt 240 lb (108.9 kg)   BMI 33.47 kg/m       No data to display              No data to display              Objective:  Physical Exam:  Gen: NAD, comfortable in exam room  Bilateral feet/ankles: Minimal splaying between digits bilaterally.  Bunionette bilaterally.  Pes cavus. Tenderness plantar fascia insertion medial calcaneus.  No other tenderness. FROM ankles with normal strength.  Minimal limitation 1st MTP dorsiflexion. Supinated gait on ambulation.  Leg lengths equal.   Assessment & Plan:  1. Left plantar fasciitis - custom orthotics made today.  No pain of metatarsalgia so did not place MT pads - can consider in future.  Discussed how to break these in, what to prompt call to for.  She felt she could use even more long arch support - advised to try breaking in first over next 1-2 weeks as the orthotics were built with quite a bit of support here.  If still feels she needs more, return and we can place scaphoid pads.  Patient was fitted for a : standard, cushioned, semi-rigid orthotic. The orthotic was heated and afterward the patient stood on the orthotic blank positioned on the orthotic stand. The patient  was positioned in subtalar neutral position and 10 degrees of ankle dorsiflexion in a weight bearing stance. After completion of molding, a stable base was applied to the orthotic blank. The blank was ground to a stable position for weight bearing. Size: 11 Base: blue med density eva Posting: none Additional orthotic padding: none

## 2023-04-16 ENCOUNTER — Other Ambulatory Visit: Payer: Self-pay | Admitting: Family Medicine

## 2023-04-16 DIAGNOSIS — E041 Nontoxic single thyroid nodule: Secondary | ICD-10-CM

## 2023-05-01 ENCOUNTER — Ambulatory Visit
Admission: RE | Admit: 2023-05-01 | Discharge: 2023-05-01 | Disposition: A | Discharge status: Home / Self Care | Payer: BC Managed Care – PPO | Source: Ambulatory Visit | Attending: Family Medicine | Admitting: Family Medicine

## 2023-05-01 DIAGNOSIS — E041 Nontoxic single thyroid nodule: Secondary | ICD-10-CM

## 2023-05-07 ENCOUNTER — Other Ambulatory Visit: Payer: Self-pay | Admitting: Family Medicine

## 2023-05-07 DIAGNOSIS — Z1231 Encounter for screening mammogram for malignant neoplasm of breast: Secondary | ICD-10-CM

## 2023-05-28 ENCOUNTER — Ambulatory Visit
Admission: RE | Admit: 2023-05-28 | Discharge: 2023-05-28 | Disposition: A | Discharge status: Home / Self Care | Payer: BC Managed Care – PPO | Source: Ambulatory Visit | Attending: Family Medicine | Admitting: Family Medicine

## 2023-05-28 ENCOUNTER — Other Ambulatory Visit: Payer: Self-pay | Admitting: Family Medicine

## 2023-05-28 DIAGNOSIS — M2550 Pain in unspecified joint: Secondary | ICD-10-CM

## 2023-05-28 DIAGNOSIS — Z1231 Encounter for screening mammogram for malignant neoplasm of breast: Secondary | ICD-10-CM

## 2023-05-29 ENCOUNTER — Other Ambulatory Visit: Payer: Self-pay | Admitting: Family Medicine

## 2023-05-29 DIAGNOSIS — M2550 Pain in unspecified joint: Secondary | ICD-10-CM

## 2024-08-01 ENCOUNTER — Other Ambulatory Visit (HOSPITAL_BASED_OUTPATIENT_CLINIC_OR_DEPARTMENT_OTHER): Payer: Self-pay

## 2024-08-01 MED FILL — Influenza Virus Vaccine Split PF Susp Pref Syringe 0.5 ML: 0.5000 mL | INTRAMUSCULAR | 1 days supply | Qty: 0.5 | Fill #0 | Status: AC

## 2024-08-04 ENCOUNTER — Other Ambulatory Visit (HOSPITAL_BASED_OUTPATIENT_CLINIC_OR_DEPARTMENT_OTHER): Payer: Self-pay
